# Patient Record
Sex: Female | Born: 1950 | Race: White | Hispanic: Yes | Smoking: Former smoker
Health system: Southern US, Community
[De-identification: ages and names within clinical notes are randomized; demographics above are authoritative.]

## PROBLEM LIST (undated history)

## (undated) DIAGNOSIS — I10 Essential (primary) hypertension: Secondary | ICD-10-CM

## (undated) HISTORY — PX: BREAST BIOPSY: SHX20

## (undated) HISTORY — PX: CERVICAL CONE BIOPSY: SUR198

## (undated) HISTORY — DX: Essential (primary) hypertension: I10

---

## 1990-09-03 HISTORY — PX: ABDOMINAL HYSTERECTOMY: SHX81

## 2005-02-16 ENCOUNTER — Encounter: Admission: RE | Admit: 2005-02-16 | Discharge: 2005-02-16 | Payer: Self-pay | Admitting: Internal Medicine

## 2006-02-04 ENCOUNTER — Ambulatory Visit: Payer: Self-pay | Admitting: Family Medicine

## 2006-02-19 ENCOUNTER — Ambulatory Visit: Payer: Self-pay | Admitting: Family Medicine

## 2006-03-05 ENCOUNTER — Ambulatory Visit (HOSPITAL_COMMUNITY): Admission: RE | Admit: 2006-03-05 | Discharge: 2006-03-05 | Payer: Self-pay | Admitting: Family Medicine

## 2006-03-21 ENCOUNTER — Ambulatory Visit: Payer: Self-pay | Admitting: Internal Medicine

## 2008-05-28 ENCOUNTER — Emergency Department (HOSPITAL_COMMUNITY): Admission: EM | Admit: 2008-05-28 | Discharge: 2008-05-28 | Payer: Self-pay | Admitting: Emergency Medicine
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
## 2010-03-10 ENCOUNTER — Encounter: Admission: RE | Admit: 2010-03-10 | Discharge: 2010-03-10 | Payer: Self-pay | Admitting: Internal Medicine

## 2011-05-02 ENCOUNTER — Other Ambulatory Visit: Payer: Self-pay | Admitting: Internal Medicine

## 2011-05-02 DIAGNOSIS — Z1231 Encounter for screening mammogram for malignant neoplasm of breast: Secondary | ICD-10-CM

## 2011-05-16 ENCOUNTER — Ambulatory Visit: Payer: Self-pay

## 2011-05-24 ENCOUNTER — Ambulatory Visit
Admission: RE | Admit: 2011-05-24 | Discharge: 2011-05-24 | Disposition: A | Discharge status: Home / Self Care | Payer: Self-pay | Source: Ambulatory Visit | Attending: Internal Medicine | Admitting: Internal Medicine

## 2011-05-24 DIAGNOSIS — Z1231 Encounter for screening mammogram for malignant neoplasm of breast: Secondary | ICD-10-CM

## 2016-03-16 ENCOUNTER — Encounter: Payer: Self-pay | Admitting: Critical Care Medicine

## 2016-03-16 ENCOUNTER — Encounter: Payer: Self-pay | Admitting: Pharmacist

## 2016-03-16 DIAGNOSIS — I10 Essential (primary) hypertension: Secondary | ICD-10-CM

## 2016-03-16 LAB — GLUCOSE, POCT (MANUAL RESULT ENTRY): POC Glucose: 68 mg/dl — AB (ref 70–99)

## 2016-03-16 NOTE — Assessment & Plan Note (Signed)
HTN new onset Not well controlled Plan Lisinopril hctz 10/12.5 daily Rx sent and samples given Comm health wellness visit given

## 2016-03-16 NOTE — Progress Notes (Signed)
   Subjective:    Patient ID: Becky Holland, female    DOB: September 21, 1950, 65 y.o.   MRN: [id number]  Hypertension This is a new problem. The problem is uncontrolled. Pertinent negatives include no anxiety, blurred vision, chest pain, headaches, malaise/fatigue, neck pain, orthopnea, palpitations, peripheral edema, PND, shortness of breath or sweats. There are no associated agents to hypertension. There are no known risk factors for coronary artery disease. Past treatments include nothing. There is no history of angina, kidney disease, CVA or heart failure. There is no history of chronic renal disease or a hypertension causing med.      Review of Systems  Constitutional: Negative for malaise/fatigue.  Eyes: Negative for blurred vision.  Respiratory: Negative for shortness of breath.   Cardiovascular: Negative for chest pain, palpitations, orthopnea and PND.  Musculoskeletal: Negative for neck pain.  Neurological: Negative for headaches.       Objective:   Physical Exam Filed Vitals:   03/16/16 1200  BP: 163/91  Pulse: 77    Gen: Pleasant, well-nourished, in no distress,  normal affect  ENT: No lesions,  mouth clear,  oropharynx clear, no postnasal drip  Neck: No JVD, no TMG, no carotid bruits  Lungs: No use of accessory muscles, no dullness to percussion, clear without rales or rhonchi  Cardiovascular: RRR, heart sounds normal, no murmur or gallops, no peripheral edema  Abdomen: soft and NT, no HSM,  BS normal  Musculoskeletal: No deformities, no cyanosis or clubbing  Neuro: alert, non focal  Skin: Warm, no lesions or rashes  No results found.        Assessment & Plan:  I personally reviewed all images and lab data in the [facility] system as well as any outside material available during this office visit and agree with the  radiology impressions.   HTN (hypertension) HTN new onset Not well controlled Plan Lisinopril hctz 10/12.5 daily Rx sent and samples  given Comm health wellness visit given    [name] was seen today for hypertension.  Diagnoses and all orders for this visit:  Essential hypertension

## 2016-03-16 NOTE — Progress Notes (Signed)
Patient ID: Becky Holland, female   DOB: 01/26/1951, 65 y.o.   MRN: [id number]  [place] with Dr. Asencion Noble Lisinopril-hydrochlorothiazide 10-12.5 mg 1 tablet daily started and dispensed 14 tablets  Patient education provided and patient verbalized understanding. Patient referred to [facility] to establish care.

## 2016-03-16 NOTE — Addendum Note (Signed)
Addended by: Forde Dandy on: 03/16/2016 11:20 AM   Modules accepted: Medications

## 2016-03-20 ENCOUNTER — Inpatient Hospital Stay: Payer: Self-pay | Admitting: Family Medicine

## 2016-04-03 ENCOUNTER — Ambulatory Visit: Payer: Self-pay | Attending: Internal Medicine

## 2016-04-09 ENCOUNTER — Ambulatory Visit: Payer: Self-pay

## 2016-04-16 ENCOUNTER — Ambulatory Visit: Payer: Self-pay | Attending: Internal Medicine

## 2017-03-01 ENCOUNTER — Encounter (HOSPITAL_COMMUNITY): Payer: Self-pay | Admitting: Emergency Medicine

## 2017-03-01 ENCOUNTER — Ambulatory Visit (HOSPITAL_COMMUNITY)
Admission: EM | Admit: 2017-03-01 | Discharge: 2017-03-01 | Disposition: A | Discharge status: Home / Self Care | Payer: Self-pay | Attending: Internal Medicine | Admitting: Internal Medicine

## 2017-03-01 DIAGNOSIS — S00462A Insect bite (nonvenomous) of left ear, initial encounter: Secondary | ICD-10-CM

## 2017-03-01 DIAGNOSIS — W57XXXA Bitten or stung by nonvenomous insect and other nonvenomous arthropods, initial encounter: Secondary | ICD-10-CM

## 2017-03-01 DIAGNOSIS — R21 Rash and other nonspecific skin eruption: Secondary | ICD-10-CM

## 2017-03-01 MED ORDER — CETIRIZINE HCL 5 MG PO TABS: 5.0000 mg | ORAL_TABLET | Freq: Every day | ORAL | 0 refills | Status: DC

## 2017-03-01 NOTE — ED Triage Notes (Signed)
Pt c/o rash on left ear onset 7 days... Thought it was due to an insect bite  Denies fevers, chills  A&O x4... NAD... Ambulatory

## 2017-03-01 NOTE — ED Provider Notes (Signed)
CSN: [id number]     Arrival date & time 03/01/17  1738 History   None    Chief Complaint  Patient presents with  . Rash   (Consider location/radiation/quality/duration/timing/severity/associated sxs/prior Treatment) 66 yo female comes in with 1 week history of rash and itching of the left external ear. Thinks it is due to a bug bite. Patient has been scratching the area. Denies ear discharge/pain, decrease in hearing. Denies fever, chills, malaise. Denies burning sensation.       History reviewed. No pertinent past medical history. Past Surgical History:  Procedure Laterality Date  . ABDOMINAL HYSTERECTOMY     History reviewed. No pertinent family history. Social History  Substance Use Topics  . Smoking status: Never Smoker  . Smokeless tobacco: Never Used  . Alcohol use Not on file   OB History    No data available     Review of Systems  Constitutional: Negative for chills, diaphoresis, fatigue and fever.  HENT: Negative for ear discharge and ear pain.   Respiratory: Negative for shortness of breath and wheezing.   Cardiovascular: Negative for chest pain and palpitations.  Skin: Positive for rash.    Allergies  Naproxen  Home Medications   Prior to Admission medications   Medication Sig Start Date End Date Taking? Authorizing Provider  cetirizine (ZYRTEC) 5 MG tablet Take 1 tablet (5 mg total) by mouth daily. 03/01/17   Tasia Catchings, Yashika Mask V, PA-C  lisinopril-hydrochlorothiazide (PRINZIDE,ZESTORETIC) 10-12.5 MG tablet Take 1 tablet by mouth daily.    [provider]   Meds Ordered and Administered this Visit  Medications - No data to display  BP (!) 150/84 (BP Location: Right Arm)   Pulse 74   Temp 98.7 F (37.1 C) (Oral)   Resp 18   SpO2 98%  No data found.   Physical Exam  Constitutional: She is oriented to person, place, and time. She appears well-developed and well-nourished.  HENT:  Head: Normocephalic and atraumatic.  Right Ear: Tympanic membrane,  external ear and ear canal normal.  Left Ear: Tympanic membrane and ear canal normal.  Nose: Nose normal. Right sinus exhibits no maxillary sinus tenderness and no frontal sinus tenderness. Left sinus exhibits no maxillary sinus tenderness and no frontal sinus tenderness.  Mouth/Throat: Uvula is midline, oropharynx is clear and moist and mucous membranes are normal.  Left external ear with isolated rash/wound. Swelling of the external ear with erythema. No increased warmth.   Lymphadenopathy:    She has cervical adenopathy.  Neurological: She is alert and oriented to person, place, and time.  Skin: Skin is warm and dry.  Psychiatric: She has a normal mood and affect. Her behavior is normal. Judgment normal.    Urgent Care Course     Procedures (including critical care time)  Labs Review Labs Reviewed - No data to display  Imaging Review No results found.        MDM   1. Bug bite, initial encounter    1. Discussed with patient likely having a local reaction to bug bite. Start Cetirizine 5mg  QD to help with the itching. Encouraged patient to stop itching. Currently with no signs of infection, patient to monitor for increased warmth, spreading redness, changes in hearing. Given rash is isolated to one, with local swelling that spreads throughout multiple dermatomes, low suspicion for shingles.     Ok Edwards, PA-C 03/01/17 1835

## 2017-09-03 DIAGNOSIS — I10 Essential (primary) hypertension: Secondary | ICD-10-CM | POA: Insufficient documentation

## 2017-09-03 HISTORY — DX: Essential (primary) hypertension: I10

## 2018-05-04 HISTORY — PX: EYE SURGERY: SHX253

## 2018-07-04 HISTORY — PX: EYE SURGERY: SHX253

## 2018-10-21 ENCOUNTER — Ambulatory Visit: Payer: Self-pay | Admitting: Internal Medicine

## 2018-10-21 ENCOUNTER — Encounter: Payer: Self-pay | Admitting: Internal Medicine

## 2018-10-21 VITALS — BP 128/78 | HR 84 | Resp 14 | Ht <= 58 in | Wt 135.5 lb

## 2018-10-21 DIAGNOSIS — M778 Other enthesopathies, not elsewhere classified: Secondary | ICD-10-CM

## 2018-10-21 DIAGNOSIS — J3089 Other allergic rhinitis: Secondary | ICD-10-CM

## 2018-10-21 DIAGNOSIS — Z79899 Other long term (current) drug therapy: Secondary | ICD-10-CM

## 2018-10-21 DIAGNOSIS — M7581 Other shoulder lesions, right shoulder: Principal | ICD-10-CM

## 2018-10-21 DIAGNOSIS — I10 Essential (primary) hypertension: Secondary | ICD-10-CM

## 2018-10-21 DIAGNOSIS — M7582 Other shoulder lesions, left shoulder: Principal | ICD-10-CM

## 2018-10-21 MED ORDER — LISINOPRIL 20 MG PO TABS: 20.0000 mg | ORAL_TABLET | Freq: Every day | ORAL | 3 refills | Status: DC

## 2018-10-21 MED ORDER — CETIRIZINE HCL 10 MG PO TABS: 10.0000 mg | ORAL_TABLET | Freq: Every day | ORAL | 11 refills | Status: DC

## 2018-10-21 MED ORDER — ACETAMINOPHEN 500 MG PO TABS: 500.0000 mg | ORAL_TABLET | Freq: Four times a day (QID) | ORAL | 0 refills | Status: DC | PRN

## 2018-10-21 NOTE — Progress Notes (Signed)
Subjective:    Patient ID: Becky Holland, female   DOB: 1951/03/20, 68 y.o.   MRN: [id number]   HPI   Here to establish  1.  Hoarseness with runny nose and posterior pharyngeal drainage.  She does have an itchy throat that leads to a cough.  Eyes run as well, but not itchy.  + Sneezing at times.  Started as a cold about 1.5 months ago. Symptoms are worse when she wakes in the morning.   Changes her bedsheets every other week. They do have a lot of trees around the home and leaves keep collecting there.   Has tried honey. Also treated with prednisone, Azithromycin, Tessalon perles by a previous physician beginning of January.   Symptoms improved, but still there.  She has not had this before. Lives in [place] on [address].  Has lived there 5 years.   She has not noted moisture or mold issues she is aware of.   They have had rats and cockroaches in the trailer, but that was 6 months to 1 year ago.  She is using some insecticide powder to keep the roach population down. No carpeting.  2.  Skin tag:  Upper left back.  Has enlarged with time.  No pain or bleeding.  No color change--always black.  3.  Hypertension:  Not clear how long ago she was diagnosed with hypertension.  Has a prescription dated 2017, though just restarted on medication, Lisinopril 20 mg, about 2 months ago.    4.  Bilateral arm pain:  Points to upper arms.  Pain with abduction and flexion.  Points to insertion of deltoid on humerus.  Feels popping in that area.  Has had this pain for about 2 years.   Takes Calcium and feels it improved.   Feels a pulsating throbbing discomfort.   Current Meds  Medication Sig  . lisinopril (PRINIVIL,ZESTRIL) 20 MG tablet Take 20 mg by mouth daily.   Allergies  Allergen Reactions  . Naproxen    Past Medical History:  Diagnosis Date  . Hypertension 2019    Past Surgical History:  Procedure Laterality Date  . ABDOMINAL HYSTERECTOMY  1992   For fibroid tumor   . BREAST BIOPSY     Benign--about age 62  . CERVICAL CONE BIOPSY     probably around age 65 or 26    Family History  Problem Relation Age of Onset  . Stroke Mother   . 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 Stroke Brother 34  . Hypertension Brother     Social History   Socioeconomic History  . Marital status: Widowed    Spouse name: Not on file  . Number of children: 3  . Years of education: Not on file  . Highest education level: Not on file  Occupational History  . Occupation: business Solicitor Needs  . Financial resource strain: Not on file  . Food insecurity:    Worry: Not on file    Inability: Not on file  . Transportation needs:    Medical: Not on file    Non-medical: Not on file  Tobacco Use  . Smoking status: Never Smoker  . Smokeless tobacco: Never Used  Substance and Sexual Activity  . Alcohol use: Yes    Frequency: Never    Comment: History of overuse many years ago when husband left her.  Rare now.    . Drug use: Never  .  Sexual activity: Not on file  Lifestyle  . Physical activity:    Days per week: Not on file    Minutes per session: Not on file  . Stress: Not on file  Relationships  . Social connections:    Talks on phone: Not on file    Gets together: Not on file    Attends religious service: Not on file    Active member of club or organization: Not on file    Attends meetings of clubs or organizations: Not on file    Relationship status: Not on file  . Intimate partner violence:    Fear of current or ex partner: Not on file    Emotionally abused: Not on file    Physically abused: Not on file    Forced sexual activity: Not on file  Other Topics Concern  . Not on file  Social History Narrative   Husband left her thought never formally divorced.     He died in early 86s   Lives with her daughter and her husband and 3 grandchildren.     Review of Systems    Objective:   BP 128/78 (BP Location:  Left Arm, Patient Position: Sitting, Cuff Size: Normal)   Pulse 84   Resp 14   Ht 4\' 8"  (1.422 m)   Wt 135 lb 8 oz (61.5 kg)   BMI 30.38 kg/m   Physical Exam  NAD HEENT:  PERRL, EOMI, conjunctivae without injection.  TMs pearly gray.  Nasal mucosa swollen and boggy.   Mild cobbling of posterior pharynx. Neck:  Supple, No adenopathy, no thyromegaly Chest:  CTA CV:  RRR with normal S1 and S2, No S3, S4 or murmur.  No carotid bruits.  Carotid, radial and DP pulses normal and equal Abd:  S, NT, No HSM or mass, + BS LE:  No edema. Skin:  4 mm well defined dark brown to black smooth round lesion on left mid back. MS:  Full ROM of upper arms/shoulders.  Tender only where deltoid inserts.    NT over subacromial bursae, AC and CC joints.  Assessment & Plan  1.  Allergies:  Zyrtec 10 mg daily. Discussed pillow and mattress covers and cleaning bedsheets weekly along with weekly dusting and vacuuming. To remove shoes at door and keep leaves cleaned up around home to decrease mold formation. [employer] referral for [employer] inspection and recommendations:   Hold on referral until her daughter, who owns the trailer can come with her.  She is afraid this referral will make her daughter angry.   2.  Hypertension:  Controlled on Lisinopril.  Fasting labs to check renal function in 1 week.  3.  Shoulder tendinitis:  Seems most prominent at insertion of deltoids bilaterally.  Tylenol on regular basis--limited with NSAID allergy.  Gentle ROM exercises given.  4.  Skin lesion on back:  Appears benign.  Watch for now.  Can remove, but discussed would leave a scar.

## 2018-10-24 ENCOUNTER — Telehealth: Payer: Self-pay | Admitting: Internal Medicine

## 2018-10-24 NOTE — Telephone Encounter (Signed)
Social Work Theatre manager ([place]) spoke with Jackqueline to welcome her to [employer] and offer support services. Destry was happy to receive the call and said that she was interested in services and would appreciate a call back next week.

## 2018-10-29 ENCOUNTER — Telehealth: Payer: Self-pay | Admitting: Internal Medicine

## 2018-10-29 NOTE — Telephone Encounter (Signed)
The social work intern ([place]) called patient and left message regarding her need for a payment plan so that she can pay her medical bills. The SWI encouraged the patient to call back if she still needs help with this issue.

## 2018-10-30 ENCOUNTER — Other Ambulatory Visit: Payer: Self-pay

## 2018-10-31 ENCOUNTER — Other Ambulatory Visit: Payer: Self-pay

## 2018-10-31 DIAGNOSIS — Z13220 Encounter for screening for lipoid disorders: Secondary | ICD-10-CM

## 2018-10-31 DIAGNOSIS — Z79899 Other long term (current) drug therapy: Secondary | ICD-10-CM

## 2018-11-01 LAB — COMPREHENSIVE METABOLIC PANEL
A/G RATIO: 2 (ref 1.2–2.2)
ALT: 21 IU/L (ref 0–32)
AST: 23 IU/L (ref 0–40)
Albumin: 4.1 g/dL (ref 3.8–4.8)
Alkaline Phosphatase: 74 IU/L (ref 39–117)
BILIRUBIN TOTAL: 0.3 mg/dL (ref 0.0–1.2)
BUN / CREAT RATIO: 29 — AB (ref 12–28)
BUN: 20 mg/dL (ref 8–27)
CHLORIDE: 108 mmol/L — AB (ref 96–106)
CO2: 24 mmol/L (ref 20–29)
Calcium: 9.6 mg/dL (ref 8.7–10.3)
Creatinine, Ser: 0.7 mg/dL (ref 0.57–1.00)
GFR calc non Af Amer: 89 mL/min/{1.73_m2} (ref >59)
GFR, EST AFRICAN AMERICAN: 103 mL/min/{1.73_m2} (ref >59)
Globulin, Total: 2.1 g/dL (ref 1.5–4.5)
Glucose: 93 mg/dL (ref 65–99)
POTASSIUM: 5.4 mmol/L — AB (ref 3.5–5.2)
SODIUM: 144 mmol/L (ref 134–144)
TOTAL PROTEIN: 6.2 g/dL (ref 6.0–8.5)

## 2018-11-01 LAB — CBC WITH DIFFERENTIAL/PLATELET
BASOS: 1 %
Basophils Absolute: 0.1 10*3/uL (ref 0.0–0.2)
EOS (ABSOLUTE): 0.1 10*3/uL (ref 0.0–0.4)
Eos: 2 %
Hematocrit: 40.5 % (ref 34.0–46.6)
Hemoglobin: 13.2 g/dL (ref 11.1–15.9)
IMMATURE GRANS (ABS): 0 10*3/uL (ref 0.0–0.1)
Immature Granulocytes: 0 %
Lymphocytes Absolute: 2.3 10*3/uL (ref 0.7–3.1)
Lymphs: 40 %
MCH: 30.9 pg (ref 26.6–33.0)
MCHC: 32.6 g/dL (ref 31.5–35.7)
MCV: 95 fL (ref 79–97)
Monocytes Absolute: 0.6 10*3/uL (ref 0.1–0.9)
Monocytes: 10 %
NEUTROS ABS: 2.8 10*3/uL (ref 1.4–7.0)
Neutrophils: 47 %
PLATELETS: 433 10*3/uL (ref 150–450)
RBC: 4.27 x10E6/uL (ref 3.77–5.28)
RDW: 13.1 % (ref 11.7–15.4)
WBC: 5.9 10*3/uL (ref 3.4–10.8)

## 2018-11-01 LAB — LIPID PANEL W/O CHOL/HDL RATIO
CHOLESTEROL TOTAL: 238 mg/dL — AB (ref 100–199)
HDL: 54 mg/dL (ref >39)
LDL Calculated: 155 mg/dL — ABNORMAL HIGH (ref 0–99)
TRIGLYCERIDES: 146 mg/dL (ref 0–149)
VLDL CHOLESTEROL CAL: 29 mg/dL (ref 5–40)

## 2018-11-13 ENCOUNTER — Other Ambulatory Visit: Payer: Self-pay

## 2018-11-13 DIAGNOSIS — E875 Hyperkalemia: Secondary | ICD-10-CM

## 2018-11-14 LAB — BASIC METABOLIC PANEL
BUN/Creatinine Ratio: 23 (ref 12–28)
BUN: 18 mg/dL (ref 8–27)
CALCIUM: 9.5 mg/dL (ref 8.7–10.3)
CO2: 24 mmol/L (ref 20–29)
CREATININE: 0.78 mg/dL (ref 0.57–1.00)
Chloride: 106 mmol/L (ref 96–106)
GFR calc Af Amer: 90 mL/min/{1.73_m2} (ref >59)
GFR, EST NON AFRICAN AMERICAN: 78 mL/min/{1.73_m2} (ref >59)
Glucose: 89 mg/dL (ref 65–99)
POTASSIUM: 5.3 mmol/L — AB (ref 3.5–5.2)
Sodium: 143 mmol/L (ref 134–144)

## 2018-12-10 ENCOUNTER — Other Ambulatory Visit: Payer: Self-pay

## 2019-01-07 ENCOUNTER — Other Ambulatory Visit (INDEPENDENT_AMBULATORY_CARE_PROVIDER_SITE_OTHER): Payer: Self-pay

## 2019-01-07 ENCOUNTER — Other Ambulatory Visit: Payer: Self-pay

## 2019-01-07 DIAGNOSIS — I10 Essential (primary) hypertension: Secondary | ICD-10-CM

## 2019-01-08 LAB — BASIC METABOLIC PANEL
BUN/Creatinine Ratio: 28 (ref 12–28)
BUN: 18 mg/dL (ref 8–27)
CO2: 23 mmol/L (ref 20–29)
Calcium: 9.5 mg/dL (ref 8.7–10.3)
Chloride: 102 mmol/L (ref 96–106)
Creatinine, Ser: 0.64 mg/dL (ref 0.57–1.00)
GFR calc Af Amer: 106 mL/min/{1.73_m2} (ref >59)
GFR calc non Af Amer: 92 mL/min/{1.73_m2} (ref >59)
Glucose: 93 mg/dL (ref 65–99)
Potassium: 5.3 mmol/L — ABNORMAL HIGH (ref 3.5–5.2)
Sodium: 138 mmol/L (ref 134–144)

## 2019-01-19 ENCOUNTER — Ambulatory Visit: Payer: Self-pay | Admitting: Internal Medicine

## 2019-01-19 ENCOUNTER — Other Ambulatory Visit: Payer: Self-pay

## 2019-01-19 ENCOUNTER — Encounter: Payer: Self-pay | Admitting: Internal Medicine

## 2019-01-19 VITALS — BP 122/80 | HR 82 | Resp 12 | Ht <= 58 in | Wt 139.0 lb

## 2019-01-19 DIAGNOSIS — I10 Essential (primary) hypertension: Secondary | ICD-10-CM

## 2019-01-19 DIAGNOSIS — Z1239 Encounter for other screening for malignant neoplasm of breast: Secondary | ICD-10-CM

## 2019-01-19 DIAGNOSIS — Z9189 Other specified personal risk factors, not elsewhere classified: Secondary | ICD-10-CM

## 2019-01-19 DIAGNOSIS — Z0000 Encounter for general adult medical examination without abnormal findings: Secondary | ICD-10-CM

## 2019-01-19 LAB — POCT URINALYSIS DIPSTICK
Bilirubin, UA: NEGATIVE
Glucose, UA: NEGATIVE
Ketones, UA: NEGATIVE
Leukocytes, UA: NEGATIVE
Nitrite, UA: NEGATIVE
Protein, UA: NEGATIVE
Spec Grav, UA: <=1.005 — AB (ref 1.010–1.025)
Urobilinogen, UA: 0.2 E.U./dL
pH, UA: 6.5 (ref 5.0–8.0)

## 2019-01-19 MED ORDER — MOMETASONE FUROATE 50 MCG/ACT NA SUSP: NASAL | 11 refills | Status: DC

## 2019-01-19 MED ORDER — LISINOPRIL-HYDROCHLOROTHIAZIDE 10-12.5 MG PO TABS: 1.0000 | ORAL_TABLET | Freq: Every day | ORAL | 11 refills | Status: DC

## 2019-01-19 NOTE — Progress Notes (Signed)
Subjective:    Patient ID: Becky Holland, female   DOB: 10/24/1950, 68 y.o.   MRN: [id number]   HPI   Lanelle Bal, her granddaughter, interprets.    CPE without pap  1.  Pap:  Has not had a pap smear for more than 8 years. History of conization of cervix when in her 2s for precancerous cells.   Had a hysterectomy for fibroids later in life.    2.  Mammogram:  She thinks last was in 2012.  Normal.  No family history of breast cancer  3.  Osteoprevention:  Likes milk, but cannot drink it in large amounts as causes bloating and abdominal pain.  She would be willing to try Lactaid milk.  Does not like Almond Milk. Does walk at times --maybe once or twice weekly. Does feel she can get out daily and work up to 1 hour of physical activity.  4.  Guaiac Cards:  Never.    5.  Colonoscopy:  Never.  No family history of colon cancer.  6.  Immunizations:  Tdap about 8 years ago.  She thinks at [facility].    7.  Glucose/Cholesterol:  No elevated glucose in past.  Her cholesterol was mildly elevated in 2.2020 Lipid Panel     Component Value Date/Time   CHOL 238 (H) 10/31/2018 0849   TRIG 146 10/31/2018 0849   HDL 54 10/31/2018 0849   LDLCALC 155 (H) 10/31/2018 0849    Current Meds  Medication Sig  . acetaminophen (TYLENOL) 500 MG tablet Take 1 tablet (500 mg total) by mouth every 6 (six) hours as needed.  . cetirizine (ZYRTEC) 10 MG tablet Take 1 tablet (10 mg total) by mouth daily.  Marland Kitchen lisinopril (PRINIVIL,ZESTRIL) 20 MG tablet Take 1 tablet (20 mg total) by mouth daily.   Allergies  Allergen Reactions  . Naproxen Swelling    Eyes swelled with Naproxen    Past Medical History:  Diagnosis Date  . Hypertension 2019    Past Surgical History:  Procedure Laterality Date  . ABDOMINAL HYSTERECTOMY  1992   For fibroid tumor; also left Oophorectomy.    Marland Kitchen BREAST BIOPSY     Benign--about age 34  . CERVICAL CONE BIOPSY     probably around age 62 or 50 for  precancerous cells    Family History  Problem Relation Age of Onset  . Stroke Mother   . 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 Stroke Brother 5  . Hypertension Brother     Social History   Socioeconomic History  . Marital status: Widowed    Spouse name: Not on file  . Number of children: 3  . Years of education: Not on file  . Highest education level: Not on file  Occupational History  . Occupation: business Solicitor Needs  . Financial resource strain: Not on file  . Food insecurity:    Worry: Not on file    Inability: Not on file  . Transportation needs:    Medical: Not on file    Non-medical: Not on file  Tobacco Use  . Smoking status: Never Smoker  . Smokeless tobacco: Never Used  Substance and Sexual Activity  . Alcohol use: Yes    Frequency: Never    Comment: History of overuse many years ago when husband left her.  Rare now.    . Drug use: Never  . Sexual activity: Not on file  Lifestyle  . Physical activity:    Days per week: Not on file    Minutes per session: Not on file  . Stress: Not on file  Relationships  . Social connections:    Talks on phone: Not on file    Gets together: Not on file    Attends religious service: Not on file    Active member of club or organization: Not on file    Attends meetings of clubs or organizations: Not on file    Relationship status: Not on file  . Intimate partner violence:    Fear of current or ex partner: Not on file    Emotionally abused: Not on file    Physically abused: Not on file    Forced sexual activity: Not on file  Other Topics Concern  . Not on file  Social History Narrative   Husband left her though never formally divorced.     He died in early 80s   Lives with her daughter and her husband and 3 grandchildren.     Review of Systems  Constitutional: Negative for appetite change, fatigue and fever.  HENT: Positive for hearing loss (Decreased hearing from right  ear for past 2 months.), postnasal drip, rhinorrhea, sneezing and sore throat (Throat itchy at times). Negative for dental problem (Does not like her teeth separation.  Her teeth break easily.  Would like a dental referral.) and mouth sores (Decreased hearing from right ear.  Maybe for past 2 months. ).   Eyes: Positive for discharge (clear watering.) and redness. Negative for itching.  Respiratory: Negative for shortness of breath.   Cardiovascular: Negative for chest pain, palpitations and leg swelling.  Gastrointestinal: Positive for constipation (rare) and diarrhea (rare). Negative for abdominal pain and blood in stool (No melena.).  Genitourinary: Negative for vaginal bleeding and vaginal discharge.  Musculoskeletal: Positive for arthralgias (Some pain in knees at times.).       Left thigh cramps at times.  Skin: Negative for rash.  Neurological: Positive for headaches (pinching pain in right temple about 4 days ago.  Lasted most of the day.  Took Tylenol twice and awakened next day fine. ). Negative for weakness and numbness.  Psychiatric/Behavioral: Negative for dysphoric mood. The patient is not nervous/anxious.       Objective:   BP 122/80 (BP Location: Left Arm, Patient Position: Sitting, Cuff Size: Normal)   Pulse 82   Resp 12   Ht 4\' 8"  (1.422 m)   Wt 139 lb (63 kg)   BMI 31.16 kg/m   Physical Exam  Constitutional: She is oriented to person, place, and time. She appears well-developed and well-nourished.  HENT:  Head: Normocephalic and atraumatic.  Right Ear: Tympanic membrane, external ear and ear canal normal.  Left Ear: Tympanic membrane, external ear and ear canal normal.  Nose: Mucosal edema present.  Mouth/Throat: Uvula is midline, oropharynx is clear and moist and mucous membranes are normal.  Eyes: Pupils are equal, round, and reactive to light. Conjunctivae and EOM are normal.  Discs sharp bilaterally  Neck: Normal range of motion and full passive range of  motion without pain. Neck supple. No thyroid mass and no thyromegaly present.  Cardiovascular: Normal rate, regular rhythm, S1 normal and S2 normal. Exam reveals no S3, no S4 and no friction rub.  No murmur heard. No carotid bruits.  Carotid, radial, femoral, DP and PT pulses normal and equal.   Varicosities of lower extremities.  Pulmonary/Chest: Effort normal and breath sounds  normal. Right breast exhibits no inverted nipple, no mass, no nipple discharge, no skin change and no tenderness. Left breast exhibits no inverted nipple, no mass, no nipple discharge, no skin change and no tenderness.  Abdominal: Soft. Bowel sounds are normal. She exhibits no mass. There is no hepatosplenomegaly. There is no abdominal tenderness. No hernia.  Genitourinary:    Genitourinary Comments: Normal external female genitalia No uterine or adnexal mass or tenderness Rectal:  No mass, heme negative light brown stool.   Musculoskeletal: Normal range of motion.     Comments: Hypertrophic changes of knees bilaterally.  Lymphadenopathy:       Head (right side): No submental and no submandibular adenopathy present.       Head (left side): No submental and no submandibular adenopathy present.    She has no cervical adenopathy.    She has no axillary adenopathy.       Right: No inguinal and no supraclavicular adenopathy present.       Left: No inguinal and no supraclavicular adenopathy present.  Neurological: She is alert and oriented to person, place, and time. She has normal strength and normal reflexes. No cranial nerve deficit or sensory deficit. Coordination and gait normal.  Skin: Skin is warm. No rash noted.  Psychiatric: She has a normal mood and affect. Her speech is normal and behavior is normal. Judgment and thought content normal.     Assessment & Plan   1.  CPE without pap Schedule mammogram Due for tetanus booster in 2 more years. Encouraged influenza vaccine.  2.  Recurrent mild hyperkalemia:   Switch from Lisinopril 20 mg to Lisinopril/HCTZ 10/12.5 mg as seems most likely from the Lisinopril.  3  Hypertension:  Changes to Lisinopril as above.  4.  Varicose Veins:  No symptoms.  Encourage elevation of legs in the evenings.  5.  OA of knees.  Tylenol as needed  6.  Right Eustachian Tube dysfunction.  Appears to have mild allergies.  Zyrtec 10 mg daily. Nasonex 2 sprays each nostril daily.  7.  Dental referral

## 2019-01-19 NOTE — Patient Instructions (Signed)

## 2019-01-26 ENCOUNTER — Encounter: Payer: Self-pay | Admitting: Internal Medicine

## 2019-02-02 ENCOUNTER — Other Ambulatory Visit: Payer: Self-pay

## 2019-02-02 ENCOUNTER — Other Ambulatory Visit (INDEPENDENT_AMBULATORY_CARE_PROVIDER_SITE_OTHER): Payer: Self-pay

## 2019-02-02 DIAGNOSIS — Z1211 Encounter for screening for malignant neoplasm of colon: Secondary | ICD-10-CM

## 2019-02-02 LAB — POC HEMOCCULT BLD/STL (HOME/3-CARD/SCREEN)
Card #2 Fecal Occult Blod, POC: NEGATIVE
Card #3 Fecal Occult Blood, POC: NEGATIVE
Fecal Occult Blood, POC: NEGATIVE

## 2019-02-05 ENCOUNTER — Telehealth: Payer: Self-pay | Admitting: Internal Medicine

## 2019-02-05 NOTE — Telephone Encounter (Signed)
Patient called requesting any medication for itchiness on the back of her right leg above the ankle x two days, patient states has swollen on same leg since yesterday. Patient has been  applying benadryl cream original 1% multiples times (more than 5 times), she states cream helped a little bit but symptoms stills. Patient thinks was stuck by an insect; not sure which one was it.  Please advise.

## 2019-02-10 NOTE — Telephone Encounter (Signed)
Called patient back to get further details. Patient states she is better and does not need appointment. Patient states she was stung by a wasp.

## 2019-02-19 ENCOUNTER — Other Ambulatory Visit: Payer: Self-pay

## 2019-07-16 ENCOUNTER — Other Ambulatory Visit (HOSPITAL_COMMUNITY): Payer: Self-pay | Admitting: *Deleted

## 2019-07-16 DIAGNOSIS — N644 Mastodynia: Secondary | ICD-10-CM

## 2019-07-23 ENCOUNTER — Encounter: Payer: Self-pay | Admitting: Internal Medicine

## 2019-07-23 ENCOUNTER — Ambulatory Visit: Payer: Self-pay | Admitting: Internal Medicine

## 2019-07-23 ENCOUNTER — Other Ambulatory Visit: Payer: Self-pay

## 2019-07-23 VITALS — BP 130/80 | HR 72 | Resp 12 | Ht <= 58 in | Wt 129.0 lb

## 2019-07-23 DIAGNOSIS — E875 Hyperkalemia: Secondary | ICD-10-CM

## 2019-07-23 DIAGNOSIS — I10 Essential (primary) hypertension: Secondary | ICD-10-CM

## 2019-07-23 DIAGNOSIS — Z23 Encounter for immunization: Secondary | ICD-10-CM

## 2019-07-23 DIAGNOSIS — J3089 Other allergic rhinitis: Secondary | ICD-10-CM | POA: Insufficient documentation

## 2019-07-23 DIAGNOSIS — R0781 Pleurodynia: Secondary | ICD-10-CM

## 2019-07-23 DIAGNOSIS — D171 Benign lipomatous neoplasm of skin and subcutaneous tissue of trunk: Secondary | ICD-10-CM

## 2019-07-23 DIAGNOSIS — E7800 Pure hypercholesterolemia, unspecified: Secondary | ICD-10-CM | POA: Insufficient documentation

## 2019-07-23 MED ORDER — MOMETASONE FUROATE 50 MCG/ACT NA SUSP: NASAL | 11 refills | Status: DC

## 2019-07-23 NOTE — Patient Instructions (Signed)
[facility] First floor [address], [address] [place],  [address] 

## 2019-07-23 NOTE — Progress Notes (Signed)
Subjective:    Patient ID: Becky Holland, female   DOB: 09-Oct-1950, 68 y.o.   MRN: [id number]   HPI   1.  Hypercholesterolemia:  High back in February.  States she has changed her diet and physical activity.  She is eating more vegetables, but is not eating fruit much as she feels it has too much sugar. She is walking daily for 2 hours. Has lost 10 lbs  Lipid Panel     Component Value Date/Time   CHOL 238 (H) 10/31/2018 0849   TRIG 146 10/31/2018 0849   HDL 54 10/31/2018 0849   LDLCALC 155 (H) 10/31/2018 0849   LABVLDL 29 10/31/2018 0849   2.  Allergies:  Has a lot of posterior pharyngeal drainage.  Never went to [facility] to obtain Nasonex.   She did purchase OTC at [facility] and only used for 2 weeks.  She misunderstood directions and stopped after that.  She did find it helped with her drainage and cough.  3.  Hypertension:  Doing fine on Lisinopril/HCTZ daily.   4.  Has mass that is tender on bilateral posterior chest wall just lateral to scapulae--almost in same area.  Has had for several months.  No change with time.  5.  Mammogram:  Has not been done yet--was ordered at beginning of pandemic.  Apparently when being set up recently, patient mentioned some discomfort in her right breast, so switched to diagnostic mammogram via BCCCP  Current Meds  Medication Sig  . acetaminophen (TYLENOL) 500 MG tablet Take 1 tablet (500 mg total) by mouth every 6 (six) hours as needed.  . cetirizine (ZYRTEC) 10 MG tablet Take 1 tablet (10 mg total) by mouth daily.  Marland Kitchen lisinopril-hydrochlorothiazide (ZESTORETIC) 10-12.5 MG tablet Take 1 tablet by mouth daily.  . Multiple Vitamin (MULTI-VITAMIN) tablet Take by mouth daily.    Allergies  Allergen Reactions  . Naproxen Swelling    Eyes swelled with Naproxen      Review of Systems    Objective:   BP 130/80 (BP Location: Left Arm, Patient Position: Sitting, Cuff Size: Normal)   Pulse 72   Resp 12   Ht 4\' 8"  (1.422 m)    Wt 129 lb (58.5 kg)   BMI 28.92 kg/m   Physical Exam  NAD Lungs:  CTA CV:  RRR without murmur or rub.  Radial pulses normal and equal LE:  No edema Chest wall:  2.5 cm soft tissue mass at SQ level perhaps at 4-5 rib level just lateral to right scapula.  Moves easily and with minimal tenderness.  No overlying skin abnormality. Perhaps a thickening in same area on left, but no defined mass.  Extends to overly lateral scapula on left, though NT once palpating scapular area.  Tender with thickening over rib her--again would estimate this is the 4th or 5th rib and posterior.   Assessment & Plan  1.  Hypercholesterolemia:  Has made substantial changes to diet and physical activity for 10 lb weight loss. FLP  2.  Hypertension:  Control is fine.  As in #1.  Has had hyperkalemia in past with Lisinopril alone and so placed on combination Lisinopril/HCTZ.  Repeat BMP today.  3.  Lipoma of posterior right chest wall.  Suspect muscular/tendinous inflammation on left with no defined mass, but will check rib xray to see if any underlying bony changes to suggest more concerning process.  4.  Right breast discomfort--switched by BCCCP to diagnostic breast imaging for next month.  5.  HM:  Pneunococcal 13.  23 valent vaccine in 1 year.  6.  Allergies:  To restart Nasonex OTC and go back to MAP to apply for continuation.  To use daily during her allergy seasons.

## 2019-07-24 LAB — BASIC METABOLIC PANEL
BUN/Creatinine Ratio: 37 — ABNORMAL HIGH (ref 12–28)
BUN: 23 mg/dL (ref 8–27)
CO2: 20 mmol/L (ref 20–29)
Calcium: 10.2 mg/dL (ref 8.7–10.3)
Chloride: 104 mmol/L (ref 96–106)
Creatinine, Ser: 0.63 mg/dL (ref 0.57–1.00)
GFR calc Af Amer: 107 mL/min/{1.73_m2} (ref >59)
GFR calc non Af Amer: 92 mL/min/{1.73_m2} (ref >59)
Glucose: 96 mg/dL (ref 65–99)
Potassium: 5.6 mmol/L — ABNORMAL HIGH (ref 3.5–5.2)
Sodium: 142 mmol/L (ref 134–144)

## 2019-07-24 LAB — LIPID PANEL W/O CHOL/HDL RATIO
Cholesterol, Total: 224 mg/dL — ABNORMAL HIGH (ref 100–199)
HDL: 73 mg/dL (ref >39)
LDL Chol Calc (NIH): 139 mg/dL — ABNORMAL HIGH (ref 0–99)
Triglycerides: 68 mg/dL (ref 0–149)
VLDL Cholesterol Cal: 12 mg/dL (ref 5–40)

## 2019-07-29 ENCOUNTER — Other Ambulatory Visit: Payer: Self-pay | Admitting: Internal Medicine

## 2019-07-29 ENCOUNTER — Ambulatory Visit
Admission: RE | Admit: 2019-07-29 | Discharge: 2019-07-29 | Disposition: A | Discharge status: Home / Self Care | Payer: Self-pay | Source: Ambulatory Visit | Attending: Internal Medicine | Admitting: Internal Medicine

## 2019-07-29 DIAGNOSIS — R0781 Pleurodynia: Secondary | ICD-10-CM

## 2019-08-04 ENCOUNTER — Encounter (HOSPITAL_COMMUNITY): Payer: Self-pay

## 2019-08-04 ENCOUNTER — Ambulatory Visit (HOSPITAL_COMMUNITY)
Admission: RE | Admit: 2019-08-04 | Discharge: 2019-08-04 | Disposition: A | Discharge status: Home / Self Care | Payer: No Typology Code available for payment source | Source: Ambulatory Visit | Attending: Obstetrics and Gynecology | Admitting: Obstetrics and Gynecology

## 2019-08-04 ENCOUNTER — Other Ambulatory Visit: Payer: Self-pay

## 2019-08-04 DIAGNOSIS — N644 Mastodynia: Secondary | ICD-10-CM | POA: Insufficient documentation

## 2019-08-04 DIAGNOSIS — Z1239 Encounter for other screening for malignant neoplasm of breast: Secondary | ICD-10-CM

## 2019-08-04 NOTE — Progress Notes (Signed)
Complaints of right breast pain x one year that radiates from the nipple area to the outer breast. Patient states the pain comes and goes. Patient rates the pain at a 4 out of 10. Patient complained of a right breast lump x 20 years that has increased in size.  Pap Smear: Pap smear not completed today. Last Pap smear was in 2010 at [facility] and normal per patient. Per patient has a history of an abnormal Pap smear 30 years ago that a [facility]C was completed for follow-up. Patient has a history of a hysterectomy in 1992 due to fibroids. Patient doesn't need any further Pap smears due to her history of a hysterectomy for benign reasons per BCCCP and ACOG guidelines. No Pap smear results are in Epic.  Physical exam: Breasts Right breast larger than left breast that per patient is normal for her. No skin abnormalities bilateral breasts. No nipple retraction bilateral breasts. No nipple discharge bilateral breasts. No lymphadenopathy. No lumps palpated bilateral breasts. Unable to palpate a lump in patients area of concern within the right breast. Complaints of diffuse right breast pain on exam. Referred patient to the [facility] for a diagnostic mammogram. Appointment scheduled for Wednesday, August 05, 2019 at 1010.        Pelvic/Bimanual No Pap smear completed today since patient has a history of a hysterectomy for benign reasons. Pap smear not indicated per BCCCP guidelines.   Smoking History: Patient has never smoked.  Patient Navigation: Patient education provided. Access to services provided for patient through [facility] program. Spanish interpreter provided.   Colorectal Cancer Screening: Per patient has never had a colonoscopy completed. Patient completed a FOBT Test 02/03/2019 that was negative. No complaints today.   Breast and Cervical Cancer Risk Assessment: Patient has no family history of breast cancer, known genetic mutations, or radiation treatment to the chest  before age 64. Per patient has a history of cervical dysplasia. Patient has no history of being immunocompromised or DES exposure in-utero.  Risk Assessment    Risk Scores      08/04/2019   Last edited by: Loletta Parish, RN   5-year risk:    Lifetime risk:          Used Spanish interpreter Rudene Anda from [place].

## 2019-08-04 NOTE — Patient Instructions (Signed)
Explained breast self awareness with [employer]. Patient did not need a Pap smear today due to patient has a history of a hysterectomy for benign reasons. Let patient know that she doesn't need any further Pap smears due to her history of a hysterectomy for benign reasons. Referred patient to the [facility] for a diagnostic mammogram. Appointment scheduled for Wednesday, August 05, 2019 at 1010. Patient aware of appointment and will be there. Becky Holland verbalized understanding.  Katelan Hirt, Arvil Chaco, RN 3:54 PM

## 2019-08-05 ENCOUNTER — Ambulatory Visit: Payer: Self-pay

## 2019-08-05 ENCOUNTER — Ambulatory Visit
Admission: RE | Admit: 2019-08-05 | Discharge: 2019-08-05 | Disposition: A | Discharge status: Home / Self Care | Payer: No Typology Code available for payment source | Source: Ambulatory Visit | Attending: Obstetrics and Gynecology | Admitting: Obstetrics and Gynecology

## 2019-08-05 DIAGNOSIS — N644 Mastodynia: Secondary | ICD-10-CM

## 2019-08-13 ENCOUNTER — Other Ambulatory Visit (INDEPENDENT_AMBULATORY_CARE_PROVIDER_SITE_OTHER): Payer: Self-pay

## 2019-08-13 ENCOUNTER — Other Ambulatory Visit: Payer: Self-pay

## 2019-08-13 DIAGNOSIS — E875 Hyperkalemia: Secondary | ICD-10-CM

## 2019-08-14 LAB — BASIC METABOLIC PANEL
BUN/Creatinine Ratio: 21 (ref 12–28)
BUN: 13 mg/dL (ref 8–27)
CO2: 24 mmol/L (ref 20–29)
Calcium: 9.4 mg/dL (ref 8.7–10.3)
Chloride: 90 mmol/L — ABNORMAL LOW (ref 96–106)
Creatinine, Ser: 0.63 mg/dL (ref 0.57–1.00)
GFR calc Af Amer: 107 mL/min/{1.73_m2} (ref >59)
GFR calc non Af Amer: 92 mL/min/{1.73_m2} (ref >59)
Glucose: 94 mg/dL (ref 65–99)
Potassium: 5.1 mmol/L (ref 3.5–5.2)
Sodium: 124 mmol/L — ABNORMAL LOW (ref 134–144)

## 2019-10-24 ENCOUNTER — Ambulatory Visit: Payer: No Typology Code available for payment source | Attending: Internal Medicine

## 2019-10-24 DIAGNOSIS — Z23 Encounter for immunization: Secondary | ICD-10-CM | POA: Insufficient documentation

## 2019-10-24 NOTE — Progress Notes (Signed)
   Covid-19 Vaccination Clinic  Name:  Becky Holland    MRN: [id number] DOB: [birth year]  10/24/2019  [name] was observed post Covid-19 immunization for 15 minutes without incidence. She was provided with Vaccine Information Sheet and instruction to access the V-Safe system.   [name] was instructed to call 911 with any severe reactions post vaccine: Marland Kitchen Difficulty breathing  . Swelling of your face and throat  . A fast heartbeat  . A bad rash all over your body  . Dizziness and weakness    Immunizations Administered    Name Date Dose VIS Date Route   Pfizer COVID-19 Vaccine 10/24/2019 12:01 PM 0.3 mL 08/14/2019 Intramuscular   Manufacturer: [place]   Lot: [id number]   [place]: [id number]

## 2019-11-17 ENCOUNTER — Ambulatory Visit: Payer: Self-pay | Attending: Internal Medicine

## 2019-11-17 DIAGNOSIS — Z23 Encounter for immunization: Secondary | ICD-10-CM

## 2019-11-17 NOTE — Progress Notes (Signed)
   Covid-19 Vaccination Clinic  Name:  Becky Holland    MRN: [id number] DOB: Apr 29, 1951  11/17/2019  Becky Holland was observed post Covid-19 immunization for 15 minutes without incident. She was provided with Vaccine Information Sheet and instruction to access the V-Safe system.   Becky Holland was instructed to call 911 with any severe reactions post vaccine: Marland Kitchen Difficulty breathing  . Swelling of face and throat  . A fast heartbeat  . A bad rash all over body  . Dizziness and weakness   Immunizations Administered    Name Date Dose VIS Date Route   Pfizer COVID-19 Vaccine 11/17/2019 11:58 AM 0.3 mL 08/14/2019 Intramuscular   Manufacturer: [place]   Lot: [id number]   [place]: [id number]

## 2019-12-23 ENCOUNTER — Ambulatory Visit: Payer: Self-pay | Admitting: Internal Medicine

## 2019-12-23 ENCOUNTER — Encounter: Payer: Self-pay | Admitting: Internal Medicine

## 2019-12-23 ENCOUNTER — Other Ambulatory Visit: Payer: Self-pay

## 2019-12-23 VITALS — BP 122/78 | HR 82 | Resp 12 | Ht <= 58 in | Wt 126.0 lb

## 2019-12-23 DIAGNOSIS — K602 Anal fissure, unspecified: Secondary | ICD-10-CM

## 2019-12-23 DIAGNOSIS — K648 Other hemorrhoids: Secondary | ICD-10-CM | POA: Insufficient documentation

## 2019-12-23 MED ORDER — HYDROCORTISONE ACETATE 25 MG RE SUPP: 25.0000 mg | Freq: Two times a day (BID) | RECTAL | 0 refills | Status: DC

## 2019-12-23 NOTE — Progress Notes (Signed)
    Subjective:    Patient ID: Becky Holland, female   DOB: 1951-07-06, 69 y.o.   MRN: [id number]   HPI   Interpreted by Theodis Aguas  One month ago, she noted blood on tissue with wiping following BMs intermittently.  Has become more frequent.  No blood in stool or outside of stool--only on tissue after wiping.  She has had sense of inflammation of her anal area at times--particularly this past Saturday, 4 days ago.  Stools can be of small caliber.  No ribbon-like stools.  No melena.   Has not tried any over the counter remedies. Stools are soft.   No diarrhea.   Has not had this before. May have had constipation, however, before her symptoms started.  Has never had a colonoscopy  Current Meds  Medication Sig  . acetaminophen (TYLENOL) 500 MG tablet Take 1 tablet (500 mg total) by mouth every 6 (six) hours as needed.  . cetirizine (ZYRTEC) 10 MG tablet Take 1 tablet (10 mg total) by mouth daily.  Marland Kitchen lisinopril-hydrochlorothiazide (ZESTORETIC) 10-12.5 MG tablet Take 1 tablet by mouth daily.  . Multiple Vitamin (MULTI-VITAMIN) tablet Take by mouth daily.    Allergies  Allergen Reactions  . Naproxen Swelling    Eyes swelled with Naproxen      Review of Systems    Objective:   BP 122/78 (BP Location: Left Arm, Patient Position: Sitting, Cuff Size: Normal)   Pulse 82   Resp 12   Ht 4\' 8"  (1.422 m)   Wt 126 lb (57.2 kg)   BMI 28.25 kg/m   Physical Exam  NAD Abd:  S, NT, No HSM or mass, + BS Rectal/anoscopy:  2 areas of fissuring underlying mild to moderated beefy looking internal hemorrhoid.  At about 7 O'clock.  No mass.  Quiescent external anal tags   Assessment & Plan  1.  Fissuring with internal hemorrhoids:  Discussed gentle treatment of the area--avoid forceful wiping.   Would recommend soft washcloth to clean gently, followed by tucks and then Anusol HC supp twice daily for next 3 days or so. No antibacterial or other harsh soaps Metamucil daily with  increased water to avoid hard or loose stools.

## 2019-12-23 NOTE — Patient Instructions (Signed)
Tome mucho agua Tome Metamucil una vez al dia con 8 ounces agua.  Obtenir Tucks [employer] Hazel pads)

## 2019-12-25 ENCOUNTER — Ambulatory Visit: Payer: Self-pay | Admitting: Internal Medicine

## 2020-01-21 ENCOUNTER — Encounter: Payer: Self-pay | Admitting: Internal Medicine

## 2020-01-21 ENCOUNTER — Ambulatory Visit (INDEPENDENT_AMBULATORY_CARE_PROVIDER_SITE_OTHER): Payer: Self-pay | Admitting: Internal Medicine

## 2020-01-21 ENCOUNTER — Other Ambulatory Visit: Payer: Self-pay

## 2020-01-21 VITALS — BP 120/72 | HR 82 | Resp 12 | Ht <= 58 in | Wt 122.0 lb

## 2020-01-21 DIAGNOSIS — Z1159 Encounter for screening for other viral diseases: Secondary | ICD-10-CM

## 2020-01-21 DIAGNOSIS — Z780 Asymptomatic menopausal state: Secondary | ICD-10-CM

## 2020-01-21 DIAGNOSIS — K625 Hemorrhage of anus and rectum: Secondary | ICD-10-CM

## 2020-01-21 DIAGNOSIS — Z0000 Encounter for general adult medical examination without abnormal findings: Secondary | ICD-10-CM

## 2020-01-21 DIAGNOSIS — Z23 Encounter for immunization: Secondary | ICD-10-CM

## 2020-01-21 MED ORDER — CETIRIZINE HCL 10 MG PO TABS: 10.0000 mg | ORAL_TABLET | Freq: Every day | ORAL | 11 refills | Status: DC

## 2020-01-21 MED ORDER — LISINOPRIL-HYDROCHLOROTHIAZIDE 10-12.5 MG PO TABS: 1.0000 | ORAL_TABLET | Freq: Every day | ORAL | 11 refills | Status: DC

## 2020-01-21 NOTE — Progress Notes (Signed)
Subjective:    Patient ID: Becky Holland, female   DOB: 14-Mar-1951, 69 y.o.   MRN: [id number]   HPI   CPE without pap  1.  Pap:  History of abnormal pap in her 64s with conization and subsequent normalization.  History of hysterectomy for fibroids later in life.  No pap for over 9 years.  Does have one ovary.    2.  Mammogram:  Done 08/05/2019.  No family history of breast cancer.  She does have a history of benign breast mass when age 60 yo.  3.  Osteoprevention:  Only able to tolerate 1 cup of Lactaid milk daily--still gets diarrhea if more.  Was taking calcium pills (caltrate with D) until about 3 months ago, but stopped as her lips were itching.  Itching resolved.  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   Also chasing her grandchildren around.  4.  Guaiac Cards:  Negative 02/2019.  5.  Colonoscopy:  Never.  No family history of colon cancer.  Continues to have some difficulty with blood in stool.  Does have history of hemorrhoids and fissuring in past.  Notes BRB on tissue when cleans.  Not every BM.  Stools soft and easy to pass.   6.  Immunizations:  Will need Pneumococcal 23 in 07/2019.  She is due for Pneumococcal 23 in 07/2020.  She has not had a tetanus vaccine in over 20 years.    7.  Glucose/Cholesterol:  Fasting glucose fine.  Cholesterol total mildly high, but with a very high HDL.  Lipid Panel     Component Value Date/Time   CHOL 224 (H) 07/23/2019 1024   TRIG 68 07/23/2019 1024   HDL 73 07/23/2019 1024   LDLCALC 139 (H) 07/23/2019 1024   LABVLDL 12 07/23/2019 1024     Current Meds  Medication Sig  . acetaminophen (TYLENOL) 500 MG tablet Take 1 tablet (500 mg total) by mouth every 6 (six) hours as needed.  . cetirizine (ZYRTEC) 10 MG tablet Take 1 tablet (10 mg total) by mouth daily.  Marland Kitchen lisinopril-hydrochlorothiazide (ZESTORETIC) 10-12.5 MG tablet Take 1 tablet by mouth daily.  . mometasone (NASONEX) 50 MCG/ACT  nasal spray 2 sprays each nostril daily.  . Multiple Vitamin (MULTI-VITAMIN) tablet Take by mouth daily.    Allergies  Allergen Reactions  . Naproxen Swelling    Eyes swelled with Naproxen     Past Medical History:  Diagnosis Date  . Hypertension 2019   Past Surgical History:  Procedure Laterality Date  . ABDOMINAL HYSTERECTOMY  1992   For fibroid tumor; also left Oophorectomy.    Marland Kitchen BREAST BIOPSY     Benign--about age 19  . CERVICAL CONE BIOPSY     probably around age 21 or 76 for precancerous cells  . EYE SURGERY Right 05/2018   [place]  . EYE SURGERY Left 07/2018   Dr  Lourena Simmonds are cataract extractions    Family History  Problem Relation Age of Onset  . Stroke Mother   . 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 Stroke Brother 64  . Hypertension Brother     Social History   Socioeconomic History  . Marital status: Widowed    Spouse name: Not on file  . Number of children: 3  . Years of education: Not on file  . Highest education level: 6th grade  Occupational History  . Occupation: business Education administrator  Tobacco Use  . Smoking status: Former Research scientist (life sciences)  . Smokeless tobacco: Never Used  Substance and Sexual Activity  . Alcohol use: Not Currently    Comment: History of overuse many years ago when husband left her.  Rare now.    . Drug use: Never  . Sexual activity: Not Currently  Other Topics Concern  . Not on file  Social History Narrative   Husband left her though never formally divorced.     He died in early 28s   Lives with her daughter and her husband and 3 grandchildren.   Social Determinants of Health   Financial Resource Strain:   . Difficulty of Paying Living Expenses:   Food Insecurity: No Food Insecurity  . Worried About Charity fundraiser in the Last Year: Never true  . Ran Out of Food in the Last Year: Never true  Transportation Needs: No Transportation Needs  . Lack of Transportation (Medical): No  . Lack  of Transportation (Non-Medical): No  Physical Activity:   . Days of Exercise per Week:   . Minutes of Exercise per Session:   Stress:   . Feeling of Stress :   Social Connections:   . Frequency of Communication with Friends and Family:   . Frequency of Social Gatherings with Friends and Family:   . Attends Religious Services:   . Active Member of Clubs or Organizations:   . Attends Archivist Meetings:   Marland Kitchen Marital Status:   Intimate Partner Violence:   . Fear of Current or Ex-Partner:   . Emotionally Abused:   Marland Kitchen Physically Abused:   . Sexually Abused:     Review of Systems  Constitutional: Negative for appetite change and fatigue.  HENT: Positive for dental problem (Already set up with [place] [facility] and working on her concerns.) and hearing loss (Very mild on left side.  ). Negative for rhinorrhea and sore throat.   Eyes: Positive for visual disturbance (Does wear glasses.  Had cataracts removed from both eyes in 2019.  Dr. Susa Simmonds).  Respiratory: Negative for cough and shortness of breath.   Cardiovascular: Negative for chest pain, palpitations and leg swelling.  Gastrointestinal: Positive for blood in stool (has BRB on tissue frequently when wiping.). Negative for abdominal pain, constipation and diarrhea.  Genitourinary: Negative for dysuria, frequency, vaginal bleeding and vaginal discharge.  Musculoskeletal: Negative for arthralgias.  Skin: Negative for rash.  Neurological: Negative for weakness and numbness.  Psychiatric/Behavioral: Positive for dysphoric mood (At times, but short lived and currently not interested in counseling.). The patient is not nervous/anxious.       Objective:   BP 120/72 (BP Location: Left Arm, Patient Position: Sitting, Cuff Size: Normal)   Pulse 82   Resp 12   Ht 4\' 8"  (1.422 m)   Wt 122 lb (55.3 kg)   BMI 27.35 kg/m   Physical Exam  Constitutional: She is oriented to person, place, and time. She appears well-developed  and well-nourished.  HENT:  Head: Normocephalic and atraumatic.  Right Ear: Tympanic membrane, external ear and ear canal normal.  Left Ear: Tympanic membrane, external ear and ear canal normal.  Nose: Nose normal.  Mouth/Throat: Oropharynx is clear and moist and mucous membranes are normal.  Some dental decay.  Eyes: Conjunctivae and EOM are normal. Right pupil is not round. Right pupil is reactive. Left pupil is round and reactive.  Right pupil with obvious surgical changes--oblong in diagonal orientation.  Neck: No thyromegaly present.  Cardiovascular: Normal  rate, regular rhythm, S1 normal and S2 normal. Exam reveals no S3, no S4 and no friction rub.  No murmur heard. Diffuse Varicosities and spider veins of bilateral LE  Pulmonary/Chest: Effort normal and breath sounds normal. Right breast exhibits no inverted nipple, no mass, no nipple discharge, no skin change and no tenderness. Left breast exhibits no inverted nipple, no mass, no nipple discharge, no skin change and no tenderness.  Abdominal: Soft. Bowel sounds are normal. She exhibits no mass. There is no hepatosplenomegaly. There is no abdominal tenderness. No hernia.  Genitourinary:    Genitourinary Comments: Normal external female genitalia.  Absent cervix and uterus on bimanual exam.  No adnexal mass or tenderness.   Musculoskeletal:        General: No edema. Normal range of motion.     Cervical back: Full passive range of motion without pain, normal range of motion and neck supple.     Comments: Hypertrophic and varus changes to knees  Lymphadenopathy:       Head (right side): No submental and no submandibular adenopathy present.       Head (left side): No submental and no submandibular adenopathy present.    She has no cervical adenopathy.    She has no axillary adenopathy.       Right: No inguinal and no supraclavicular adenopathy present.       Left: No inguinal and no supraclavicular adenopathy present.  Neurological:  She is alert and oriented to person, place, and time. She has normal strength and normal reflexes. No cranial nerve deficit or sensory deficit. Coordination and gait normal.  Skin: Skin is warm. No rash noted.  Psychiatric: She has a normal mood and affect. Her speech is normal and behavior is normal. Judgment and thought content normal. Cognition and memory are normal.     Assessment & Plan  1.  CPE without pap Hep C screen Tdap today. Pneumococcal 23 in November or after. Mammogram not due until December Will see if can get affordable DEXA as postmenopausal and has not been consistent with calcium and vitamin D intake. Citracal 600 mg with D twice daily--to see if any problems with lip itching with this version of calcium.  2.  Hypertension:  Controlled.  CMP  3.  BRBPR:  Will see if can get diagnostic Colonoscopy.  CBC  She has had hemorrhoids and fissuring before when more active bleeding, but continues to have this intermittently.

## 2020-01-22 LAB — CBC WITH DIFFERENTIAL/PLATELET
Basophils Absolute: 0.1 10*3/uL (ref 0.0–0.2)
Basos: 1 %
EOS (ABSOLUTE): 0.1 10*3/uL (ref 0.0–0.4)
Eos: 2 %
Hematocrit: 38.5 % (ref 34.0–46.6)
Hemoglobin: 12.8 g/dL (ref 11.1–15.9)
Immature Grans (Abs): 0 10*3/uL (ref 0.0–0.1)
Immature Granulocytes: 0 %
Lymphocytes Absolute: 2.1 10*3/uL (ref 0.7–3.1)
Lymphs: 38 %
MCH: 30.9 pg (ref 26.6–33.0)
MCHC: 33.2 g/dL (ref 31.5–35.7)
MCV: 93 fL (ref 79–97)
Monocytes Absolute: 0.5 10*3/uL (ref 0.1–0.9)
Monocytes: 10 %
Neutrophils Absolute: 2.7 10*3/uL (ref 1.4–7.0)
Neutrophils: 49 %
Platelets: 400 10*3/uL (ref 150–450)
RBC: 4.14 x10E6/uL (ref 3.77–5.28)
RDW: 12.3 % (ref 11.7–15.4)
WBC: 5.5 10*3/uL (ref 3.4–10.8)

## 2020-01-22 LAB — COMPREHENSIVE METABOLIC PANEL
ALT: 25 IU/L (ref 0–32)
AST: 28 IU/L (ref 0–40)
Albumin/Globulin Ratio: 1.8 (ref 1.2–2.2)
Albumin: 4.4 g/dL (ref 3.8–4.8)
Alkaline Phosphatase: 79 IU/L (ref 48–121)
BUN/Creatinine Ratio: 29 — ABNORMAL HIGH (ref 12–28)
BUN: 22 mg/dL (ref 8–27)
Bilirubin Total: 0.4 mg/dL (ref 0.0–1.2)
CO2: 25 mmol/L (ref 20–29)
Calcium: 10.2 mg/dL (ref 8.7–10.3)
Chloride: 102 mmol/L (ref 96–106)
Creatinine, Ser: 0.75 mg/dL (ref 0.57–1.00)
GFR calc Af Amer: 94 mL/min/{1.73_m2} (ref >59)
GFR calc non Af Amer: 82 mL/min/{1.73_m2} (ref >59)
Globulin, Total: 2.4 g/dL (ref 1.5–4.5)
Glucose: 96 mg/dL (ref 65–99)
Potassium: 4.9 mmol/L (ref 3.5–5.2)
Sodium: 140 mmol/L (ref 134–144)
Total Protein: 6.8 g/dL (ref 6.0–8.5)

## 2020-01-22 LAB — HEPATITIS C ANTIBODY: Hep C Virus Ab: 0.3 s/co ratio (ref 0.0–0.9)

## 2020-04-28 ENCOUNTER — Ambulatory Visit
Admission: RE | Admit: 2020-04-28 | Discharge: 2020-04-28 | Disposition: A | Discharge status: Home / Self Care | Payer: No Typology Code available for payment source | Source: Ambulatory Visit | Attending: Internal Medicine | Admitting: Internal Medicine

## 2020-04-28 ENCOUNTER — Other Ambulatory Visit: Payer: Self-pay

## 2020-04-28 DIAGNOSIS — Z780 Asymptomatic menopausal state: Secondary | ICD-10-CM

## 2020-08-04 ENCOUNTER — Other Ambulatory Visit: Payer: Self-pay

## 2020-08-04 ENCOUNTER — Ambulatory Visit: Payer: Self-pay | Admitting: Internal Medicine

## 2020-08-04 ENCOUNTER — Encounter: Payer: Self-pay | Admitting: Internal Medicine

## 2020-08-04 VITALS — BP 125/67 | HR 88 | Resp 12 | Ht <= 58 in | Wt 124.0 lb

## 2020-08-04 DIAGNOSIS — K029 Dental caries, unspecified: Secondary | ICD-10-CM

## 2020-08-04 DIAGNOSIS — K625 Hemorrhage of anus and rectum: Secondary | ICD-10-CM

## 2020-08-04 DIAGNOSIS — Z1231 Encounter for screening mammogram for malignant neoplasm of breast: Secondary | ICD-10-CM

## 2020-08-04 DIAGNOSIS — I10 Essential (primary) hypertension: Secondary | ICD-10-CM

## 2020-08-04 DIAGNOSIS — Z23 Encounter for immunization: Secondary | ICD-10-CM

## 2020-08-04 DIAGNOSIS — K219 Gastro-esophageal reflux disease without esophagitis: Secondary | ICD-10-CM

## 2020-08-04 MED ORDER — FAMOTIDINE 20 MG PO TABS: ORAL_TABLET | ORAL | 2 refills | Status: DC

## 2020-08-04 NOTE — Patient Instructions (Addendum)
Enfermedad de reflujo gastroesofgico en los adultos Gastroesophageal Reflux Disease, Adult El reflujo gastroesofgico (RGE) ocurre cuando el cido del estmago sube por el tubo que conecta la boca con el estmago (esfago). Normalmente, la comida baja por el esfago y se mantiene en el estmago, donde se la digiere. Sin embargo, cuando una persona tiene [place], los alimentos y el cido estomacal suelen volver al esfago. Si esto se vuelve un problema ms grave, a la persona se le puede diagnosticar una enfermedad llamada enfermedad de reflujo gastroesofgico (ERGE). La ERGE ocurre cuando el reflujo:  Sucede a menudo.  Causa sntomas frecuentes o graves.  Causa problemas tales como dao en el esfago. Cuando el cido del Insurance claims handler en contacto con el esfago, el cido puede provocar dolor (inflamacin) en el esfago. Con el tiempo, pueden formarse pequeos agujeros (lceras) en el revestimiento del esfago. Cules son las causas? Esta afeccin se debe a un problema en el msculo que se encuentra entre el esfago y Product manager (esfnter esofgico inferior, o EEI). Normalmente, el EEI se cierra una vez que la comida pasa a travs del esfago hasta el [place]. Cuando el EEI se encuentra debilitado o tiene alguna anomala, no se cierra por completo, y eso permite que tanto la comida como el jugo gstrico, que es cido, [place] a subir por el esfago. El EEI puede debilitarse a causa de ciertas sustancias alimenticias, medicamentos y Product/process development scientist, que incluyen:  El consumo de [place].  [place].  Tener una hernia de hiato.  Consumo de alcohol.  Ciertos alimentos y bebidas, como caf, chocolate, cebollas y [place]. Qu incrementa el riesgo? Es ms probable que tenga esta afeccin si:  Tiene un aumento del Engineer, site.  Tiene un trastorno del tejido conjuntivo.  [place] antiinflamatorios no esteroideos (AINE). Cules son los signos o los sntomas? Los sntomas de esta afeccin  incluyen:  Acidez estomacal.  Dificultad o dolor al tragar.  Sensacin de Best boy un bulto en la garganta.  Sabor amargo en la boca.  Mal aliento.  Gran cantidad de saliva.  Estmago inflamado o con Tree surgeon.  Eructos.  Dolor en el pecho. El dolor de pecho puede deberse a distintas afecciones. Es importante que consulte al mdico si tiene dolor de [place].  Dificultad para respirar o sibilancias.  Tos constante (crnica) o tos nocturna.  Desgaste del Paramedic.  Prdida de peso. Cmo se diagnostica? El mdico le har una historia clnica y un examen fsico. Para determinar si tiene ERGE leve o grave, el mdico tambin puede controlar cmo usted reacciona al tratamiento. Tambin pueden [employer], que [place] los siguientes:  Un estudio para examinarle el [place] y el esfago con una cmara pequea (endoscopa).  Una prueba para medir el grado de Musician.  Una prueba para medir cunta presin hay en el esfago.  Un estudio de deglucin con bario comn o modificado para ver la forma, el tamao y el funcionamiento del esfago. Cmo se trata? El [place] del tratamiento es ayudar a Public house manager los sntomas y Product/process development scientist las complicaciones. El tratamiento de esta afeccin puede variar segn la gravedad de los sntomas. El mdico puede recomendarle lo siguiente:  Cambios en la dieta.  Medicamentos.  Clementeen Hoof. Siga estas indicaciones en su casa: Comida y bebida   Siga la dieta recomendada por el mdico. Esto puede incluir evitar ciertos alimentos y bebidas, por ejemplo: ? Caf y t (con o sin cafena). ? Bebidas que contengan alcohol. ? Bebidas energticas y deportivas. ? Bebidas gaseosas o  refrescos. ? Chocolate y cacao. ? Menta y [place]. ? Ajo y cebolla. ? Rbano picante. ? Alimentos condimentados, picantes y cidos, por ejemplo, todos los tipos de pimientas, [place] en polvo, curry en polvo, vinagre, salsas picantes y Omnicom. ? Ctricos y sus jugos, por ejemplo, naranjas, limones y limas. ? Alimentos a base de tomate, como salsa de [place], [place], salsa picante y pizza con salsa de [place]. ? Alimentos fritos y [place], [place] donas, papas fritas y aderezos ricos en grasas. ? Carnes con alto contenido de grasa, como salchichas, y cortes de carnes rojas y blancas con mucha grasa, por ejemplo, chuletas o costillas, embutidos, jamn y tocino. ? Productos lcteos ricos en grasas, como leche [place], [place] y [place] crema.  Haga comidas pequeas y frecuentes Medical sales representative de comidas abundantes.  Evite beber grandes cantidades de lquidos con las comidas.  Evite comer 2 o 3horas antes de acostarse.  Evite recostarse inmediatamente despus de comer.  No haga ejercicios enseguida despus de comer. Estilo de vida   No consuma ningn producto que contenga nicotina o tabaco, como cigarrillos, cigarrillos electrnicos y tabaco de Higher education careers adviser. Si necesita ayuda para dejar de fumar, consulte al [employer].  Trate de reducir el estrs con mtodos como el yoga o la meditacin. Si necesita ayuda para reducir [employer] de estrs, consulte al mdico.  Si tiene sobrepeso, baje hasta llegar a un peso saludable para usted. Pdale consejos al mdico para bajar de peso de [place] segura. Indicaciones generales  Est atento a cualquier cambio en los sntomas.  Tome los medicamentos de venta libre y los recetados solamente como se lo haya indicado el mdico. No tome aspirina, ibuprofeno ni otros AINE a menos que el mdico se lo indique.  Use ropa holgada. No use nada apretado alrededor de la cintura que haga presin sobre el abdomen.  Levante (eleve) la cabecera de la cama aproximadamente 6pulgadas (15cm).  Evite inclinarse si al hacerlo empeoran los sntomas.  Concurra a todas las visitas de seguimiento como se lo haya indicado el mdico. Esto es importante. Comunquese con un mdico si:  Tiene los siguientes  sntomas: ? Sntomas nuevos. ? Prdida de peso sin causa aparente. ? Dificultad o dolor al tragar. ? Sibilancias o una tos persistente. ? Voz ronca.  Los sntomas no mejoran con Dispensing optician. Solicite ayuda inmediatamente si:  [employer], el cuello, la [place], los dientes o la espalda.  Se siente transpirado, mareado o tiene una sensacin de desvanecimiento.  Siente dolor intenso en el pecho o le falta el aire.  Vomita y el vmito tiene un aspecto similar a la sangre o a los posos de caf.  Se desmaya.  Tiene heces sanguinolentas o negras.  No puede tragar, beber o comer. Resumen  El reflujo gastroesofgico ocurre cuando el cido del estmago sube al esfago. La ERGE es una enfermedad en la que el reflujo ocurre con frecuencia, causa sntomas frecuentes o graves, o causa problemas tales como dao en el esfago.  El tratamiento de esta afeccin puede variar segn la gravedad de los sntomas. El mdico puede indicarle que siga una dieta y haga cambios en su estilo de vida, tome medicamentos o se someta a [place].  Comunquese con un mdico si tiene sntomas nuevos o los sntomas empeoran.  Tome los medicamentos de venta libre y los recetados solamente como se lo haya indicado el mdico. No tome aspirina, ibuprofeno ni otros AINE a menos que el  mdico se lo indique.  Concurra a todas las visitas de seguimiento como se lo haya indicado el mdico. Esto es importante. Esta informacin no tiene Marine scientist el consejo del mdico. Asegrese de hacerle al mdico cualquier pregunta que tenga. Document Revised: 04/03/2018 Document Reviewed: 04/03/2018 Elsevier Patient Education  2020 [employer].  Call the clinic if you redevelop symptoms after stopping the medication for reflux

## 2020-08-04 NOTE — Progress Notes (Signed)
° ° °  Subjective:    Patient ID: Becky Holland, female   DOB: 1951-04-12, 69 y.o.   MRN: [id number]   HPI   Rush Barer interprets  1.  Hypertension:  Taking Lisinopril-HCTZ regularly.  No problems.  2.  Acid reflux:  Started perhaps in June 2021.  When belches, has hot fluid up her chest and into her throat.  Belches a lot when having reflux.   When she eats spicy food or tomatoes, she has symptoms.  Has symptoms 2 days out of the week on average. Rare wine drinking. Eats onions, but not daily. At times, drinks hot chocolate--sounds fairly rare.  Has been drinking more recently.  She did have GI disturbance with gas, but no increased GERD symptoms. She drink coffee twice daily.  Large cups.   Not much in way of sodas/colas/caffeine containing drinks otherwise. No tobacco use.   She does lie down sometimes after eating--10 to 15 minutes afterward.   No melena or hematochezia.   Does not feel she has had any additional stress, even with the pandemic.  3.  BRBPR:  Missed her appt with GI, so cannot reschedule again until August of 2022.    4.  Mammogram:  She has received her letter to schedule, but has not done so.    Current Meds  Medication Sig   acetaminophen (TYLENOL) 500 MG tablet Take 1 tablet (500 mg total) by mouth every 6 (six) hours as needed.   cetirizine (ZYRTEC) 10 MG tablet Take 1 tablet (10 mg total) by mouth daily.   lisinopril-hydrochlorothiazide (ZESTORETIC) 10-12.5 MG tablet Take 1 tablet by mouth daily.   mometasone (NASONEX) 50 MCG/ACT nasal spray 2 sprays each nostril daily.   Multiple Vitamin (MULTI-VITAMIN) tablet Take by mouth daily.    Allergies  Allergen Reactions   Naproxen Swelling    Eyes swelled with Naproxen      Review of Systems    Objective:   BP 125/67 (BP Location: Left Arm, Patient Position: Sitting, Cuff Size: Normal)    Pulse 88    Resp 12    Ht 4\' 8"  (1.422 m)    Wt 124 lb (56.2 kg)    BMI 27.80 kg/m    Physical Exam  NAD HEENT:  PERRL, EOMI, Dental decay Neck:  Supple, No adenopathy Chest:  CTA CV:  RRR without murmur or rub.  Radial and DP pulses normal and equal.   Abd:  S, NT over epigastric area as well as rest of abdomen.  + BS, No HSM or mass. LE:  No edema.  Assessment & Plan   Hypertension:  controlled with current medication.    2.  Dental Decay:  Cancelled her dental appt as could not make the time.  Has not rescheduled  3.  GERD:  Famotidine 40 mg at bedtime.  GERD precautions discussed.    4.  HM:  Rx for influenza at [facility] clear they will be have any left. Pneumovax 23 v Up to date with COVID vaccination.   Encouraged shingle vaccination as can afford at pharmacy of choice Mammogram ordered.

## 2020-09-28 ENCOUNTER — Other Ambulatory Visit: Payer: Self-pay

## 2020-09-28 ENCOUNTER — Ambulatory Visit
Admission: RE | Admit: 2020-09-28 | Discharge: 2020-09-28 | Disposition: A | Discharge status: Home / Self Care | Payer: No Typology Code available for payment source | Source: Ambulatory Visit | Attending: Internal Medicine | Admitting: Internal Medicine

## 2020-09-28 ENCOUNTER — Telehealth: Payer: Self-pay | Admitting: Internal Medicine

## 2020-09-28 DIAGNOSIS — Z1231 Encounter for screening mammogram for malignant neoplasm of breast: Secondary | ICD-10-CM

## 2020-09-28 NOTE — Telephone Encounter (Signed)
Patient was referred to [facility] for a mammogram. Patient was schedule an appointment for today 09/28/2020. Patient attended her appointment, but when she arrived she was informed that they do not accept the [place] there. Patient left and received a call from a person named Rayn that informed her of a confusion where the personnel at [facility] thought the she was been see for a bone density and not for a mammogram. Patient needs help clarifying the confusion. Please advise.

## 2020-09-29 ENCOUNTER — Telehealth: Payer: Self-pay | Admitting: Internal Medicine

## 2020-09-29 NOTE — Telephone Encounter (Signed)
[place], I called [facility] [phone] and spoke to Sandy Level.  She states the patient scheduled the mammogram on her own and that is why she was told that they do not take the [facility] card. Patient was given the mammogram scholarship application to fill out so that she may get her mammogram through the [facility] program.   We also have here the [place] application  that patient needs to come in and complete so that we can fax it over to the [facility] program and pt. Can get it free of charge . Once pt. Comes in to complete application, please fax it over to [phone] along with the order place by Dr. Amil Amen. Please let the patient know someone will be reaching out to get to set up an appointment.  Thank you.

## 2020-09-29 NOTE — Telephone Encounter (Signed)
Spoke with patient and she will be coming to the office on 10/03/2020 or 10/04/2020 in the morning to fill out the [facility] scholarship application.

## 2020-10-05 NOTE — Telephone Encounter (Signed)
Spoke with patient and remind her that she needs to come to the office to fill out application for [facility] program. Patient stated that she will be coming this week to do it.

## 2020-10-11 ENCOUNTER — Encounter: Payer: Self-pay | Admitting: Clinical

## 2020-10-11 NOTE — Progress Notes (Signed)
Case management note:  Patient arrived to clinic seeking assistance to fill out scholarship form. LCSW assisted patient with filling mammogram scholarship and provided to receptionist to complete referral.

## 2020-10-13 ENCOUNTER — Ambulatory Visit: Payer: No Typology Code available for payment source

## 2020-10-17 NOTE — Telephone Encounter (Signed)
Patient came and fill out application. Application was faxed on 10/11/2020

## 2020-12-01 ENCOUNTER — Ambulatory Visit
Admission: RE | Admit: 2020-12-01 | Discharge: 2020-12-01 | Disposition: A | Discharge status: Home / Self Care | Payer: No Typology Code available for payment source | Source: Ambulatory Visit | Attending: Internal Medicine | Admitting: Internal Medicine

## 2020-12-01 ENCOUNTER — Other Ambulatory Visit: Payer: Self-pay

## 2020-12-01 DIAGNOSIS — Z1231 Encounter for screening mammogram for malignant neoplasm of breast: Secondary | ICD-10-CM

## 2020-12-05 ENCOUNTER — Other Ambulatory Visit: Payer: Self-pay | Admitting: Obstetrics and Gynecology

## 2020-12-05 DIAGNOSIS — R928 Other abnormal and inconclusive findings on diagnostic imaging of breast: Secondary | ICD-10-CM

## 2020-12-06 ENCOUNTER — Telehealth: Payer: Self-pay | Admitting: Internal Medicine

## 2020-12-06 NOTE — Telephone Encounter (Signed)
Patient called to let you know that she received a call from the office where she got the mammogram and was told that they find something and needs to apply for the Pink Card to continue her follow up. Patient would like to know if you can please explained to her what were the results of her mammogram and where to obtain the [place].

## 2020-12-11 NOTE — Telephone Encounter (Signed)
There was what they call asymmetry on the left breast--something that just did not look exactly right.  They want to do more views and are asking her to sign up for the pink card so she can get the mammograms without cost for her.  The BCCCP program should have called her to set that up.  Regardless, she needs to do the follow up views.

## 2020-12-15 NOTE — Telephone Encounter (Signed)
Spoke with patient and she confirmed that she already received the call from [facility]. She will be applying for her pink card. I reminded the patient to let us know if she has any additional questions or if her follow ups are not scheduled.

## 2020-12-29 ENCOUNTER — Ambulatory Visit
Admission: RE | Admit: 2020-12-29 | Discharge: 2020-12-29 | Disposition: A | Discharge status: Home / Self Care | Payer: No Typology Code available for payment source | Source: Ambulatory Visit | Attending: Obstetrics and Gynecology | Admitting: Obstetrics and Gynecology

## 2020-12-29 ENCOUNTER — Other Ambulatory Visit: Payer: Self-pay

## 2020-12-29 ENCOUNTER — Ambulatory Visit: Payer: No Typology Code available for payment source | Admitting: *Deleted

## 2020-12-29 ENCOUNTER — Ambulatory Visit: Payer: No Typology Code available for payment source

## 2020-12-29 VITALS — BP 110/78 | Wt 127.3 lb

## 2020-12-29 DIAGNOSIS — R928 Other abnormal and inconclusive findings on diagnostic imaging of breast: Secondary | ICD-10-CM

## 2020-12-29 DIAGNOSIS — Z1239 Encounter for other screening for malignant neoplasm of breast: Secondary | ICD-10-CM

## 2020-12-29 NOTE — Progress Notes (Signed)
Ms. Becky Holland is a 70 y.o. female who presents to [facility] clinic today with no complaints. Patient referred to [facility] by the [facility] due to recommending additional imaging of the left breast. Screening mammogram completed 12/01/2020.   Pap Smear: Pap smear not completed today. Last Pap smear was in 2010 at [facility] and normal per patient. Per patient has a history of an abnormal Pap smear 32 years ago that a [facility] was completed for follow-up. Patient has a history of a hysterectomy in 1992 due to fibroids. Patient doesn't need any further Pap smears due to her history of a hysterectomy for benign reasons per BCCCP and ASCCP guidelines. No Pap smear results are in Epic.   Physical exam: Breasts Right breast larger than left breast that per patient is normal for her. No skin abnormalities bilateral breasts. No nipple retraction bilateral breasts. No nipple discharge bilateral breasts. No lymphadenopathy. No lumps palpated bilateral breasts. No complaints of pain or tenderness on exam.      Pelvic/Bimanual Pap is not indicated today per BCCCP guidelines.   Smoking History: Patient has never smoked.   Patient Navigation: Patient education provided. Access to services provided for patient through [facility] program. Spanish interpreter Rudene Anda from [facility] provided.   Colorectal Cancer Screening: Per patient has never had a colonoscopy completed. Patient completed a FOBT Test 02/03/2019 that was negative. No complaints today.    Breast and Cervical Cancer Risk Assessment: Patient has no family history of breast cancer, known genetic mutations, or radiation treatment to the chest before age 5. Per patient has a history of cervical dysplasia. Patient has no history of being immunocompromised or DES exposure in-utero.  Risk Assessment    Risk Scores      12/29/2020 08/04/2019   Last edited by: Royston Bake, CMA Carmel Garfield, Heath Gold, RN   5-year risk:  1.2 %    Lifetime risk: 3.6 %           A: BCCCP exam without pap smear No complaints.  P: Referred patient to the [place] for a left breast diagnostic mammogram per recommendation. Appointment scheduled Thursday, April. 28, 2022 at 1450.  Loletta Parish, RN 12/29/2020 2:17 PM

## 2020-12-29 NOTE — Patient Instructions (Addendum)
Explained breast self awareness with [employer]. Patient did not need a Pap smear today due to patient has a history of a hysterectomy for benign reasons. Let her know that she doesn't need any further Pap smears due to her history of a hysterectomy for benign reasons. Referred patient to the [facility] for a left breast diagnostic mammogram per recommendation. Appointment scheduled Thursday, April. 28, 2022 at 1450. Patient aware of appointment and will be there. Becky Holland verbalized understanding.  Jakarri Lesko, Arvil Chaco, RN 2:17 PM

## 2021-01-12 ENCOUNTER — Telehealth: Payer: Self-pay | Admitting: Internal Medicine

## 2021-01-12 NOTE — Telephone Encounter (Signed)
Patient called requesting a refill of lisinopril-hydrochlorothiazide (ZESTORETIC) 10-12.5 MG tablet. If you can please send it to [facility] at [address].

## 2021-01-16 MED ORDER — LISINOPRIL-HYDROCHLOROTHIAZIDE 10-12.5 MG PO TABS: 1.0000 | ORAL_TABLET | Freq: Every day | ORAL | 9 refills | Status: DC

## 2021-01-19 ENCOUNTER — Encounter: Payer: Self-pay | Admitting: Internal Medicine

## 2021-01-19 ENCOUNTER — Ambulatory Visit (INDEPENDENT_AMBULATORY_CARE_PROVIDER_SITE_OTHER): Payer: Self-pay | Admitting: Internal Medicine

## 2021-01-19 ENCOUNTER — Other Ambulatory Visit: Payer: Self-pay

## 2021-01-19 VITALS — BP 100/68 | HR 89 | Resp 12 | Ht <= 58 in | Wt 125.5 lb

## 2021-01-19 DIAGNOSIS — I10 Essential (primary) hypertension: Secondary | ICD-10-CM

## 2021-01-19 DIAGNOSIS — Z23 Encounter for immunization: Secondary | ICD-10-CM

## 2021-01-19 DIAGNOSIS — Z0000 Encounter for general adult medical examination without abnormal findings: Secondary | ICD-10-CM

## 2021-01-19 DIAGNOSIS — E7800 Pure hypercholesterolemia, unspecified: Secondary | ICD-10-CM

## 2021-01-19 NOTE — Progress Notes (Signed)
Subjective:    Patient ID: Becky Holland, female   DOB: [birth year], 70 y.o.   MRN: [id number]   HPI   Raquel Sarna interprets  CPE without pap  1.  Pap:  History years ago of hysterectomy.  Had biopsy of cervix years ago, which she states was benign.    2.  Mammogram:  Mammogram in March 2022 with concern for asymmetry in left breast, but extra compression views were normal.  She is to follow up in one year with annual mammogram.  No family history of breast cancer.    3.  Osteoprevention:  She drinks 3 glasses of milk daily.  She walks all day long with work.    4.  Guaiac Cards:  Negative 02/02/2019.    5.  Colonoscopy:  Never.  No family history of colon cancer.   6.  Immunizations:  Has not had Shingles vaccine or 2nd COVID booster.  7.  Glucose/Cholesterol:  Fasting blood sugars have been fine in past and cholesterol is a bit high, but with excellent HDL.   Lipid Panel     Component Value Date/Time   CHOL 224 (H) 07/23/2019 1024   TRIG 68 07/23/2019 1024   HDL 73 07/23/2019 1024   LDLCALC 139 (H) 07/23/2019 1024   LABVLDL 12 07/23/2019 1024     Current Meds  Medication Sig   acetaminophen (TYLENOL) 500 MG tablet Take 1 tablet (500 mg total) by mouth every 6 (six) hours as needed.   cetirizine (ZYRTEC) 10 MG tablet Take 1 tablet (10 mg total) by mouth daily.   fluorometholone (FML) 0.1 % ophthalmic suspension Place 1 drop into both eyes 2 times daily.   lisinopril-hydrochlorothiazide (ZESTORETIC) 10-12.5 MG tablet Take 1 tablet by mouth daily.   mometasone (NASONEX) 50 MCG/ACT nasal spray 2 sprays each nostril daily.   Multiple Vitamin (MULTI-VITAMIN) tablet Take by mouth daily.   Systane eyedrops as well--uses as needed per patient  Allergies  Allergen Reactions   Naproxen Swelling    Eyes swelled with Naproxen    Past Medical History:  Diagnosis Date   Hypertension 2019   Past Surgical History:  Procedure Laterality Date   ABDOMINAL HYSTERECTOMY   1992   For fibroid tumor; also left Oophorectomy.     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   Stroke Brother 69   Hypertension Brother    Other Brother        BPH   Other Brother     Social History   Socioeconomic History   Marital status: Widowed    Spouse name: Not on file   Number of children: 3   Years of education: Not on file   Highest education level: 6th grade  Occupational History   Occupation: business cleaning  Tobacco Use   Smoking status: Former Smoker    Types: Cigarettes   Smokeless tobacco: Never Used  Scientific laboratory technician Use: Never used  Substance and Sexual Activity   Alcohol use: Not Currently    Comment: History of overuse many years  ago when husband left her.  Rare now.     Drug use: Never   Sexual activity: Not Currently  Other Topics Concern   Not on file  Social History Narrative   Husband left her though never formally divorced.     He died in early 18s   Lives with her daughter and her husband and 3 grandchildren.   Social Determinants of Health   Financial Resource Strain: Low Risk    Difficulty of Paying Living Expenses: Not very hard  Food Insecurity: No Food Insecurity   Worried About Charity fundraiser in the Last Year: Never true   Ran Out of Food in the Last Year: Never true  Transportation Needs: No Transportation Needs   Lack of Transportation (Medical): No   Lack of Transportation (Non-Medical): No  Physical Activity: Not on file  Stress: Not on file  Social Connections: Not on file  Intimate Partner Violence: Not At Risk   Fear of Current or Ex-Partner: No   Emotionally Abused: No   Physically  Abused: No   Sexually Abused: No     Review of Systems  HENT: Positive for dental problem (Has terrible dental decay, but refuses to have them removed to have dentures.  She is going in for teeth cleaning and general exam.).   Eyes: Negative for visual disturbance (Follows with Dr. Susa Simmonds.  Has dry eyes.  Uses reading glasses.  ).  Respiratory: Negative for shortness of breath.   Cardiovascular: Negative for chest pain and leg swelling.  Musculoskeletal:       Having pain in right inner thigh when on her feet.  Has been noting for 1 month.   Not clear if acute injury caused or came on suddenly. Can hear a pop at times when turns, but not clear if has pain then.  Painful when she stretches her leg.    Has a little "ball"  On her right lateral thigh that hurts.  Pulsing pain.        Objective:   BP 100/68 (BP Location: Left Arm, Patient Position: Sitting, Cuff Size: Normal)   Pulse 89   Resp 12   Ht 4\' 9"  (1.448 m)   Wt 125 lb 8 oz (56.9 kg)   BMI 27.16 kg/m   Physical Exam HENT:     Head: Normocephalic and atraumatic.     Right Ear: Tympanic membrane, ear canal and external ear normal.     Left Ear: Tympanic membrane, ear canal and external ear normal.     Nose: Nose normal.     Mouth/Throat:     Mouth: Mucous membranes are moist.     Pharynx: Oropharynx is clear.     Comments: Significant and diffuse dental decay with multiple missing teeth. Eyes:     Extraocular Movements: Extraocular movements intact.     Conjunctiva/sclera: Conjunctivae normal.     Pupils: Pupils are equal, round, and reactive to light.     Comments: Bilateral pterygium not affecting visual fields.  Discs sharp.  Neck:     Thyroid: No thyroid mass or thyromegaly.  Cardiovascular:     Rate and Rhythm: Normal rate and regular rhythm.     Heart sounds: S1 normal and S2 normal. No murmur heard.   No friction rub. No S3 or S4 sounds.     Comments: No carotid bruits.  Carotid, radial, femoral, DP  and PT pulses normal and equal.    Pulmonary:     Effort: Pulmonary effort  is normal.     Breath sounds: Normal breath sounds.  Chest:  Breasts:    Right: No inverted nipple, mass, nipple discharge or skin change.     Left: No inverted nipple, mass, nipple discharge or skin change.  Abdominal:     General: Bowel sounds are normal.     Palpations: Abdomen is soft. There is no hepatomegaly, splenomegaly or mass.     Tenderness: There is no abdominal tenderness.     Hernia: No hernia is present.  Musculoskeletal:        General: Normal range of motion.     Cervical back: Normal range of motion and neck supple.  Lymphadenopathy:     Head:     Right side of head: No submental or submandibular adenopathy.     Left side of head: No submental or submandibular adenopathy.     Cervical: No cervical adenopathy.     Upper Body:     Right upper body: No supraclavicular or axillary adenopathy.     Left upper body: No supraclavicular or axillary adenopathy.     Lower Body: No right inguinal adenopathy. No left inguinal adenopathy.  Skin:    General: Skin is warm.     Capillary Refill: Capillary refill takes less than 2 seconds.     Findings: No rash.  Neurological:     General: No focal deficit present.     Mental Status: She is alert and oriented to person, place, and time.     Cranial Nerves: Cranial nerves 2-12 are intact.     Sensory: Sensation is intact.     Motor: Motor function is intact.     Coordination: Coordination is intact.     Gait: Gait is intact.     Deep Tendon Reflexes: Reflexes are normal and symmetric.  Psychiatric:        Attention and Perception: Attention normal.        Mood and Affect: Mood normal.        Speech: Speech normal.        Behavior: Behavior normal. Behavior is cooperative.     Assessment & Plan   CPE without pap Mammogram planned for 11/2021 Guaiac Cards x 3 to return in 2 weeks CBC, CMP, FLP Pfizer booster only today.   Encouraged obtaining  shingles vaccine at [facility].  2.  Hypertension:  controlled.  Followup in 6 months.  3.  Right thigh tendinitis:  stretches.  4.  Hypercholesterolemia:  FLP  2.

## 2021-01-19 NOTE — Telephone Encounter (Signed)
Patient was notified and she confirmed that was able to picked it up.

## 2021-01-20 LAB — COMPREHENSIVE METABOLIC PANEL
ALT: 23 IU/L (ref 0–32)
AST: 36 IU/L (ref 0–40)
Albumin/Globulin Ratio: 2 (ref 1.2–2.2)
Albumin: 4.8 g/dL (ref 3.8–4.8)
Alkaline Phosphatase: 75 IU/L (ref 44–121)
BUN/Creatinine Ratio: 29 — ABNORMAL HIGH (ref 12–28)
BUN: 18 mg/dL (ref 8–27)
Bilirubin Total: 0.5 mg/dL (ref 0.0–1.2)
CO2: 26 mmol/L (ref 20–29)
Calcium: 10.2 mg/dL (ref 8.7–10.3)
Chloride: 103 mmol/L (ref 96–106)
Creatinine, Ser: 0.63 mg/dL (ref 0.57–1.00)
Globulin, Total: 2.4 g/dL (ref 1.5–4.5)
Glucose: 92 mg/dL (ref 65–99)
Potassium: 5.7 mmol/L — ABNORMAL HIGH (ref 3.5–5.2)
Sodium: 140 mmol/L (ref 134–144)
Total Protein: 7.2 g/dL (ref 6.0–8.5)
eGFR: 95 mL/min/{1.73_m2} (ref >59)

## 2021-01-20 LAB — CBC WITH DIFFERENTIAL/PLATELET
Basophils Absolute: 0.1 10*3/uL (ref 0.0–0.2)
Basos: 1 %
EOS (ABSOLUTE): 0.1 10*3/uL (ref 0.0–0.4)
Eos: 2 %
Hematocrit: 37.5 % (ref 34.0–46.6)
Hemoglobin: 12.4 g/dL (ref 11.1–15.9)
Immature Grans (Abs): 0 10*3/uL (ref 0.0–0.1)
Immature Granulocytes: 0 %
Lymphocytes Absolute: 2.3 10*3/uL (ref 0.7–3.1)
Lymphs: 43 %
MCH: 30.8 pg (ref 26.6–33.0)
MCHC: 33.1 g/dL (ref 31.5–35.7)
MCV: 93 fL (ref 79–97)
Monocytes Absolute: 0.6 10*3/uL (ref 0.1–0.9)
Monocytes: 11 %
Neutrophils Absolute: 2.3 10*3/uL (ref 1.4–7.0)
Neutrophils: 43 %
Platelets: 460 10*3/uL — ABNORMAL HIGH (ref 150–450)
RBC: 4.02 x10E6/uL (ref 3.77–5.28)
RDW: 12.2 % (ref 11.7–15.4)
WBC: 5.3 10*3/uL (ref 3.4–10.8)

## 2021-01-20 LAB — LIPID PANEL W/O CHOL/HDL RATIO
Cholesterol, Total: 222 mg/dL — ABNORMAL HIGH (ref 100–199)
HDL: 74 mg/dL (ref >39)
LDL Chol Calc (NIH): 138 mg/dL — ABNORMAL HIGH (ref 0–99)
Triglycerides: 60 mg/dL (ref 0–149)
VLDL Cholesterol Cal: 10 mg/dL (ref 5–40)

## 2021-02-02 ENCOUNTER — Encounter: Payer: Self-pay | Admitting: Internal Medicine

## 2021-02-24 ENCOUNTER — Other Ambulatory Visit (INDEPENDENT_AMBULATORY_CARE_PROVIDER_SITE_OTHER): Payer: Self-pay

## 2021-02-24 DIAGNOSIS — Z1211 Encounter for screening for malignant neoplasm of colon: Secondary | ICD-10-CM

## 2021-02-24 LAB — POC HEMOCCULT BLD/STL (HOME/3-CARD/SCREEN)
Card #2 Fecal Occult Blod, POC: NEGATIVE
Card #3 Fecal Occult Blood, POC: NEGATIVE
Fecal Occult Blood, POC: NEGATIVE

## 2021-03-27 ENCOUNTER — Other Ambulatory Visit: Payer: Self-pay

## 2021-03-27 DIAGNOSIS — E875 Hyperkalemia: Secondary | ICD-10-CM

## 2021-03-28 LAB — BASIC METABOLIC PANEL
BUN/Creatinine Ratio: 31 — ABNORMAL HIGH (ref 12–28)
BUN: 22 mg/dL (ref 8–27)
CO2: 27 mmol/L (ref 20–29)
Calcium: 9.9 mg/dL (ref 8.7–10.3)
Chloride: 103 mmol/L (ref 96–106)
Creatinine, Ser: 0.72 mg/dL (ref 0.57–1.00)
Glucose: 90 mg/dL (ref 65–99)
Potassium: 5.1 mmol/L (ref 3.5–5.2)
Sodium: 142 mmol/L (ref 134–144)
eGFR: 90 mL/min/{1.73_m2} (ref >59)

## 2021-05-01 NOTE — Progress Notes (Signed)
Pt.notified

## 2021-07-24 ENCOUNTER — Encounter: Payer: Self-pay | Admitting: Internal Medicine

## 2021-10-18 ENCOUNTER — Telehealth: Payer: Self-pay

## 2021-10-18 NOTE — Telephone Encounter (Signed)
Patient would like an appointment or recommendation after slipping in the kitchen and possible fracturing her hip about 6 months. Patient fell on her side and heard what she believes was a cracking sound. She struggles to walk fast and has pain if she walks for a long period of time. Patient takes tylenol which helps with pain, but pain usually returns.

## 2021-10-20 ENCOUNTER — Other Ambulatory Visit: Payer: Self-pay

## 2021-10-20 ENCOUNTER — Encounter: Payer: Self-pay | Admitting: Internal Medicine

## 2021-10-20 ENCOUNTER — Ambulatory Visit: Payer: Self-pay | Admitting: Internal Medicine

## 2021-10-20 VITALS — BP 110/80 | HR 76 | Resp 12 | Ht <= 58 in | Wt 127.5 lb

## 2021-10-20 DIAGNOSIS — G8929 Other chronic pain: Secondary | ICD-10-CM | POA: Insufficient documentation

## 2021-10-20 DIAGNOSIS — H547 Unspecified visual loss: Secondary | ICD-10-CM | POA: Insufficient documentation

## 2021-10-20 DIAGNOSIS — M5442 Lumbago with sciatica, left side: Secondary | ICD-10-CM

## 2021-10-20 DIAGNOSIS — Z23 Encounter for immunization: Secondary | ICD-10-CM

## 2021-10-20 DIAGNOSIS — K0889 Other specified disorders of teeth and supporting structures: Secondary | ICD-10-CM

## 2021-10-20 MED ORDER — ACETAMINOPHEN 500 MG PO TABS: ORAL_TABLET | ORAL | 0 refills | Status: DC

## 2021-10-20 NOTE — Progress Notes (Signed)
Subjective:    Patient ID: Becky Holland, female   DOB: 1951-05-28, 71 y.o.   MRN: [id number]   HPI  Becky Holland interprets   Bilateral low back and hip pain:  Has been a problem for about 6 months.  Pain radiates into buttocks and posterior thighs bilaterally.  No weakness or numbness/tingling into legs. Pain is worse if she bends over, but has pain when standing and walking as well.  Does have pain in medial right thigh, but that is something she has had for a long time.   Has had a lot of falls and slips.  In particular slipped and fell caring for grandchildren and landed on right low back and lateral pelvis, though heard a crack or pop on her left low back.  Developed pain on left low back at the time.   Allergic to Naproxen, so has not taken anything for the pain.   2.  Requesting ophthalmologic referral:  She can no longer afford to go to Dr. Susa Simmonds.  She did not receive financial assistance with [facility].  Not clear why as at 100% or less of [employer].  She is currently paying $50 per month with his office.   Discussed we would not be able to get the laser treatments with another provider through the orange card.    3.  Dental:  left posterior molar with pain/cavity.    4.  HM:  has not had COVID bivalent nor Shingrix vaccines.  She is open to getting both today.     Current Meds  Medication Sig   acetaminophen (TYLENOL) 500 MG tablet Take 1 tablet (500 mg total) by mouth every 6 (six) hours as needed.   cetirizine (ZYRTEC) 10 MG tablet Take 1 tablet (10 mg total) by mouth daily.   fluorometholone (FML) 0.1 % ophthalmic suspension Place 1 drop into both eyes 2 times daily.   lisinopril-hydrochlorothiazide (ZESTORETIC) 10-12.5 MG tablet Take 1 tablet by mouth daily.   mometasone (NASONEX) 50 MCG/ACT nasal spray 2 sprays each nostril daily.   Multiple Vitamin (MULTI-VITAMIN) tablet Take by mouth daily.    Probiotic Product (PROBIOTIC-10 PO) Take by mouth daily.  Unsure of doseage   Allergies  Allergen Reactions   Naproxen Swelling    Eyes swelled with Naproxen      Review of Systems    Objective:   BP 110/80 (BP Location: Right Arm, Patient Position: Sitting, Cuff Size: Normal)    Pulse 76    Resp 12    Ht 4\' 9"  (1.448 m)    Wt 127 lb 8 oz (57.8 kg)    BMI 27.59 kg/m   Physical Exam HEENT:  PERRL.  Severe loss of bite surface of most of teeth remaining.  Significant tooth loss.  Large cavity in left lower posterior molar.  Multiple other teeth with cavities as well. Neck:  Supple, No adenopathy Chest:  CTA CV:  RRR without murmur or rub.  Radial pulses normal and equal MS:  NT over spinous processes of L/S spine.  NT over low back musculature Full ROM of back.  Negative straight leg raise bilaterally. Neuro:  LE:  Motor 5/5, DTRs 2+/4.   Sensory to light touch grossly normal.  Gait normal.  Moves to exam table fluidly and without obvious discomfort.     Assessment & Plan    Bilateral low back pain with bilateral radiculopathy:  not clear if this is more in the S2 distribution or L2 or multilevel.  Start with Xray of lumbosacral spine.  Suspect will ultimately need MR.  Tylenol 1000 mg twice daily.  Referral to PT in [facility].  No findings on exam today.  2.  Eye concerns:  appears she needs further follow up from an eye surgical standpoint, which we would not be able to obtain through orange card locally.  Eli will call her daughter to see if can get any clarification on whether they applied for financial assistance and if turned down, why.  Also to find out why sent to collections, which patient admits during exam.  Not clear they have been able to follow through with payment plan as she described in history.  If not clarified, will call [facility] directly.  By her history, she was to follow up end of March.  3.  Dental decay:  diffuse, though large cavity in left lower molar.  Dental clinic referral.    4. HM:  Shingrix and Moderna  bivalent vaccines today

## 2021-10-23 NOTE — Telephone Encounter (Signed)
Patient has been seen by Dr Amil Amen

## 2021-12-18 ENCOUNTER — Other Ambulatory Visit: Payer: Self-pay

## 2021-12-18 MED ORDER — LISINOPRIL-HYDROCHLOROTHIAZIDE 10-12.5 MG PO TABS: 1.0000 | ORAL_TABLET | Freq: Every day | ORAL | 9 refills | Status: DC

## 2021-12-25 ENCOUNTER — Other Ambulatory Visit: Payer: Self-pay

## 2021-12-25 DIAGNOSIS — Z1231 Encounter for screening mammogram for malignant neoplasm of breast: Secondary | ICD-10-CM

## 2022-01-16 ENCOUNTER — Inpatient Hospital Stay: Admission: RE | Admit: 2022-01-16 | Payer: No Typology Code available for payment source | Source: Ambulatory Visit

## 2022-01-17 ENCOUNTER — Ambulatory Visit: Payer: Self-pay | Admitting: Internal Medicine

## 2022-01-19 ENCOUNTER — Encounter: Payer: Self-pay | Admitting: Internal Medicine

## 2022-02-25 ENCOUNTER — Ambulatory Visit (HOSPITAL_COMMUNITY)
Admission: EM | Admit: 2022-02-25 | Discharge: 2022-02-25 | Disposition: A | Discharge status: Home / Self Care | Payer: No Typology Code available for payment source | Attending: Physician Assistant | Admitting: Physician Assistant

## 2022-02-25 ENCOUNTER — Encounter (HOSPITAL_COMMUNITY): Payer: Self-pay | Admitting: Emergency Medicine

## 2022-02-25 DIAGNOSIS — K2900 Acute gastritis without bleeding: Secondary | ICD-10-CM

## 2022-02-25 DIAGNOSIS — R110 Nausea: Secondary | ICD-10-CM

## 2022-02-25 MED ORDER — ONDANSETRON 4 MG PO TBDP
4.0000 mg | ORAL_TABLET | Freq: Once | ORAL | Status: AC
  Administered 2022-02-25: 4 mg via ORAL

## 2022-02-25 MED ORDER — ONDANSETRON 4 MG PO TBDP
ORAL_TABLET | ORAL | Status: AC
  Filled 2022-02-25: qty 1

## 2022-02-25 MED ORDER — FAMOTIDINE 20 MG PO TABS: 20.0000 mg | ORAL_TABLET | Freq: Two times a day (BID) | ORAL | 0 refills | Status: DC

## 2022-02-25 MED ORDER — ALUM & MAG HYDROXIDE-SIMETH 200-200-20 MG/5ML PO SUSP
30.0000 mL | Freq: Once | ORAL | Status: AC
  Administered 2022-02-25: 30 mL via ORAL

## 2022-02-25 MED ORDER — ALUM & MAG HYDROXIDE-SIMETH 200-200-20 MG/5ML PO SUSP
ORAL | Status: AC
  Filled 2022-02-25: qty 30

## 2022-02-25 MED ORDER — ONDANSETRON HCL 4 MG PO TABS: 4.0000 mg | ORAL_TABLET | Freq: Three times a day (TID) | ORAL | 0 refills | Status: DC | PRN

## 2022-03-05 ENCOUNTER — Encounter: Payer: Self-pay | Admitting: Internal Medicine

## 2022-03-05 ENCOUNTER — Ambulatory Visit: Payer: Self-pay | Admitting: Internal Medicine

## 2022-03-05 VITALS — BP 136/78 | HR 80 | Resp 12 | Ht <= 58 in | Wt 129.0 lb

## 2022-03-05 DIAGNOSIS — Z23 Encounter for immunization: Secondary | ICD-10-CM

## 2022-03-05 DIAGNOSIS — M47817 Spondylosis without myelopathy or radiculopathy, lumbosacral region: Secondary | ICD-10-CM

## 2022-03-05 DIAGNOSIS — Z0000 Encounter for general adult medical examination without abnormal findings: Secondary | ICD-10-CM

## 2022-03-05 DIAGNOSIS — E7800 Pure hypercholesterolemia, unspecified: Secondary | ICD-10-CM

## 2022-03-05 DIAGNOSIS — I8393 Asymptomatic varicose veins of bilateral lower extremities: Secondary | ICD-10-CM

## 2022-03-05 DIAGNOSIS — Z1231 Encounter for screening mammogram for malignant neoplasm of breast: Secondary | ICD-10-CM

## 2022-03-05 DIAGNOSIS — I10 Essential (primary) hypertension: Secondary | ICD-10-CM

## 2022-03-05 DIAGNOSIS — K2900 Acute gastritis without bleeding: Secondary | ICD-10-CM

## 2022-03-05 DIAGNOSIS — G8929 Other chronic pain: Secondary | ICD-10-CM

## 2022-03-05 NOTE — Progress Notes (Signed)
Subjective:    Patient ID: Becky Holland, female   DOB: 05/10/1951, 71 y.o.   MRN: [id number]   HPI  CPE without pap  1.  Pap:  History of TAH, LSO 30 years ago for benign reasons.  Has had paps since, and all normal.     2.  Mammogram:  Mammogram compression studies of left breast on 12/2020 ultimately showed no abnormality where screening study showed concern for asymmetry.  She has not had her follow up screening mammogram this year.  3.  Osteoprevention:  DEXA normal in 2021.  Milk gives her diarrhea. So minimal intake.  States she is taking calcium/Vitamin D supplement.  Walks all afternoon long with work.    4.  Guaiac Cards/FIT:  Negative for blood 02/24/21.   5.  Colonoscopy:  Never.  No family history of colon cancer.    6.  Immunizations:  Needs 2nd Shingrix Immunization History  Administered Date(s) Administered   Influenza-Unspecified 06/18/2019, 04/25/2020   Moderna Covid-19 Vaccine Bivalent Booster 97yr & up 10/20/2021   PFIZER Comirnaty(Gray Top)Covid-19 Tri-Sucrose Vaccine 01/19/2021   PFIZER(Purple Top)SARS-COV-2 Vaccination 10/24/2019, 11/17/2019, 06/27/2020   Pneumococcal Conjugate-13 07/23/2019   Pneumococcal Polysaccharide-23 08/04/2020   Tdap 01/21/2020   Zoster Recombinat (Shingrix) 10/20/2021     7.  Glucose/Cholesterol :  Glucose fine in past.  Cholesterol slightly high in past  8.  Other:  low back pain:  Xray on 02/28/2022 shows signficant DJD, retrolisthesis L3 on L4.  Discs spaces narrowed throughout save for L5-S1.  No radicular pain, but does have right groin pain.  She has [facility].  She feels this has helped with pain.  Current Meds  Medication Sig   cetirizine (ZYRTEC) 10 MG tablet Take 1 tablet (10 mg total) by mouth daily.   fluorometholone (FML) 0.1 % ophthalmic suspension Place 1 drop into both eyes 2 times daily.   lisinopril-hydrochlorothiazide (ZESTORETIC) 10-12.5 MG tablet Take 1 tablet by mouth  daily.   mometasone (NASONEX) 50 MCG/ACT nasal spray 2 sprays each nostril daily.   Multiple Vitamin (MULTI-VITAMIN) tablet Take by mouth daily.    Probiotic Product (PROBIOTIC-10 PO) Take by mouth daily. Unsure of doseage   Allergies  Allergen Reactions   Naproxen Swelling    Eyes swelled with Naproxen    Past Medical History:  Diagnosis Date   Hypertension 2019   Past Surgical History:  Procedure Laterality Date   ABDOMINAL HYSTERECTOMY  1992   For fibroid tumor; also left Oophorectomy.     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   Stroke Brother 69   Hypertension Brother    Other Brother        BPH   Other Brother    Social History   Socioeconomic History   Marital status: Widowed    Spouse name: Not on file   Number of children: 3   Years of education: Not on file   Highest education level: 6th grade  Occupational History   Occupation: business cleaning  Tobacco Use  Smoking status: Former    Types: Cigarettes    Passive exposure: Never   Smokeless tobacco: Never  Vaping Use   Vaping Use: Never used  Substance and Sexual Activity   Alcohol use: Not Currently    Comment: History of overuse many years ago when husband left her.  Rare now.     Drug use: Never   Sexual activity: Not Currently  Other Topics Concern   Not on file  Social History Narrative   Husband left her though never formally divorced.     He died in early 70s   Lives with her daughter and her husband and 3 grandchildren.   Social Determinants of Health   Financial Resource Strain: Low Risk  (03/05/2022)   Overall Financial Resource Strain (CARDIA)     Difficulty of Paying Living Expenses: Not hard at all  Food Insecurity: No Food Insecurity (05/08/2022)   Hunger Vital Sign    Worried About Running Out of Food in the Last Year: Never true    Ran Out of Food in the Last Year: Never true  Transportation Needs: No Transportation Needs (05/08/2022)   PRAPARE - Hydrologist (Medical): No    Lack of Transportation (Non-Medical): No  Physical Activity: Not on file  Stress: Not on file  Social Connections: Not on file  Intimate Partner Violence: Not At Risk (03/05/2022)   Humiliation, Afraid, Rape, and Kick questionnaire    Fear of Current or Ex-Partner: No    Emotionally Abused: No    Physically Abused: No    Sexually Abused: No     Review of Systems  Respiratory:  Negative for shortness of breath.   Cardiovascular:  Negative for chest pain, palpitations and leg swelling.  Gastrointestinal:  Positive for abdominal pain (Epigatric pain starting 02/25/22 which took her to ED same day.  Also with nausea.  Was prescribed Famotidine and Zofran  No longer nauseated, but taking Zofran reguarly,) and blood in stool (At times and also describes black stickty stool recently as well.).  Neurological:  Positive for dizziness (1.5 weeks ago, in line for food and felt she was spinning and would fall.  Lasted about 1 minute, went away and has not recurred,).      Objective:   BP 136/78 (BP Location: Left Arm, Patient Position: Sitting, Cuff Size: Normal)   Pulse 80   Resp 12   Ht 4' 8.5" (1.435 m)   Wt 129 lb (58.5 kg)   BMI 28.41 kg/m   Physical Exam HENT:     Head: Normocephalic and atraumatic.     Right Ear: Tympanic membrane, ear canal and external ear normal.     Left Ear: Tympanic membrane, ear canal and external ear normal.     Nose: Nose normal.     Mouth/Throat:     Mouth: Mucous membranes are moist.     Pharynx: Oropharynx is clear.     Comments: Dental decay Eyes:     Extraocular Movements: Extraocular  movements intact.     Conjunctiva/sclera: Conjunctivae normal.     Pupils:     Right eye: Pupil is not round (oblong with what appear to be post surgical changes.). Pupil is reactive.     Left eye: Pupil is reactive.  Neck:     Thyroid: No thyroid mass or thyromegaly.  Cardiovascular:     Rate and Rhythm: Normal rate and regular rhythm.     Heart sounds: S1 normal and S2 normal.  No murmur heard.    No friction rub. No S3 or S4 sounds.     Comments: No carotid bruits.  Carotid, radial, femoral, DP and PT pulses normal and equal.  Spider veins and varicosities of bilateral lower extremities.   Pulmonary:     Effort: Pulmonary effort is normal.     Breath sounds: Normal breath sounds and air entry.  Chest:  Breasts:    Right: No inverted nipple, mass, nipple discharge or skin change.     Left: No inverted nipple, mass, nipple discharge or skin change.  Abdominal:     General: Bowel sounds are normal.     Palpations: Abdomen is soft. There is no hepatomegaly, splenomegaly or mass.     Tenderness: There is no abdominal tenderness.     Hernia: No hernia is present.  Genitourinary:    Comments: Normal external female genitalia.   No adnexal mass or tenderness. Musculoskeletal:        General: Normal range of motion.     Cervical back: Normal range of motion and neck supple.     Right knee: Deformity (hypertrophic changes with varus deformity) present.     Left knee: Deformity (hypertrophic changes with varus deformity.) present.     Right lower leg: No edema.     Left lower leg: No edema.  Lymphadenopathy:     Head:     Right side of head: No submental or submandibular adenopathy.     Left side of head: No submental or submandibular adenopathy.     Cervical: No cervical adenopathy.     Upper Body:     Right upper body: No supraclavicular or axillary adenopathy.     Left upper body: No supraclavicular or axillary adenopathy.     Lower Body: No right inguinal adenopathy. No left  inguinal adenopathy.  Skin:    General: Skin is warm.     Capillary Refill: Capillary refill takes less than 2 seconds.     Findings: No rash.  Neurological:     General: No focal deficit present.     Mental Status: She is alert and oriented to person, place, and time.     Cranial Nerves: Cranial nerves 2-12 are intact.     Sensory: Sensation is intact.     Motor: Motor function is intact.     Coordination: Coordination is intact.     Gait: Gait is intact.     Deep Tendon Reflexes: Reflexes are normal and symmetric.  Psychiatric:        Mood and Affect: Mood is anxious.        Behavior: Behavior normal. Behavior is cooperative.      Assessment & Plan    CPE without pap Mammogram Shingrix #2/2 Discussed likely COVID vaccine in fall.  Influenza vaccine in fall.  2.  Rectal Bleeding/possible melena with recent gastritis:  CBC with fasting labs in next 2 weeks.  FIT to return in 2 weeks.  History of anal fissures and hemorrhoids.  Rectal bleeding chronically intermittent.  Have not been able to get her in  for diagnostic colonoscopy.  3.  Hypercholesterolemia:  FLP with upcoming labs.    4.  Hypertension:  controlled.  CMP with labs.    5.  DJD/DDD of L/S spine:  continue with PT in [place].

## 2022-03-16 ENCOUNTER — Telehealth: Payer: Self-pay

## 2022-03-16 NOTE — Telephone Encounter (Signed)
Patient called after being contacted for a follow up mammogram. Patient was told that PCP need to make a referral for her yearly mammogram before being scheduled

## 2022-03-20 ENCOUNTER — Other Ambulatory Visit (INDEPENDENT_AMBULATORY_CARE_PROVIDER_SITE_OTHER): Payer: Self-pay | Admitting: Internal Medicine

## 2022-03-20 DIAGNOSIS — Z1211 Encounter for screening for malignant neoplasm of colon: Secondary | ICD-10-CM

## 2022-03-20 LAB — POC FIT TEST STOOL: Fecal Occult Blood: NEGATIVE

## 2022-03-22 ENCOUNTER — Other Ambulatory Visit: Payer: Self-pay

## 2022-03-22 DIAGNOSIS — Z79899 Other long term (current) drug therapy: Secondary | ICD-10-CM

## 2022-03-22 DIAGNOSIS — E7800 Pure hypercholesterolemia, unspecified: Secondary | ICD-10-CM

## 2022-03-23 LAB — COMPREHENSIVE METABOLIC PANEL
ALT: 20 IU/L (ref 0–32)
AST: 22 IU/L (ref 0–40)
Albumin/Globulin Ratio: 1.8 (ref 1.2–2.2)
Albumin: 4.3 g/dL (ref 3.8–4.8)
Alkaline Phosphatase: 83 IU/L (ref 44–121)
BUN/Creatinine Ratio: 30 — ABNORMAL HIGH (ref 12–28)
BUN: 19 mg/dL (ref 8–27)
Bilirubin Total: 0.5 mg/dL (ref 0.0–1.2)
CO2: 25 mmol/L (ref 20–29)
Calcium: 10.3 mg/dL (ref 8.7–10.3)
Chloride: 98 mmol/L (ref 96–106)
Creatinine, Ser: 0.64 mg/dL (ref 0.57–1.00)
Globulin, Total: 2.4 g/dL (ref 1.5–4.5)
Glucose: 89 mg/dL (ref 70–99)
Potassium: 4.6 mmol/L (ref 3.5–5.2)
Sodium: 135 mmol/L (ref 134–144)
Total Protein: 6.7 g/dL (ref 6.0–8.5)
eGFR: 94 mL/min/{1.73_m2} (ref >59)

## 2022-03-23 LAB — CBC WITH DIFFERENTIAL/PLATELET
Basophils Absolute: 0.1 10*3/uL (ref 0.0–0.2)
Basos: 1 %
EOS (ABSOLUTE): 0.2 10*3/uL (ref 0.0–0.4)
Eos: 3 %
Hematocrit: 35.7 % (ref 34.0–46.6)
Hemoglobin: 12.3 g/dL (ref 11.1–15.9)
Immature Grans (Abs): 0 10*3/uL (ref 0.0–0.1)
Immature Granulocytes: 0 %
Lymphocytes Absolute: 2.7 10*3/uL (ref 0.7–3.1)
Lymphs: 44 %
MCH: 31.9 pg (ref 26.6–33.0)
MCHC: 34.5 g/dL (ref 31.5–35.7)
MCV: 93 fL (ref 79–97)
Monocytes Absolute: 0.7 10*3/uL (ref 0.1–0.9)
Monocytes: 11 %
Neutrophils Absolute: 2.5 10*3/uL (ref 1.4–7.0)
Neutrophils: 41 %
Platelets: 492 10*3/uL — ABNORMAL HIGH (ref 150–450)
RBC: 3.85 x10E6/uL (ref 3.77–5.28)
RDW: 12.5 % (ref 11.7–15.4)
WBC: 6.1 10*3/uL (ref 3.4–10.8)

## 2022-03-23 LAB — LIPID PANEL W/O CHOL/HDL RATIO
Cholesterol, Total: 215 mg/dL — ABNORMAL HIGH (ref 100–199)
HDL: 67 mg/dL (ref >39)
LDL Chol Calc (NIH): 132 mg/dL — ABNORMAL HIGH (ref 0–99)
Triglycerides: 92 mg/dL (ref 0–149)
VLDL Cholesterol Cal: 16 mg/dL (ref 5–40)

## 2022-04-05 ENCOUNTER — Inpatient Hospital Stay: Admission: RE | Admit: 2022-04-05 | Payer: Self-pay | Source: Ambulatory Visit

## 2022-04-09 ENCOUNTER — Encounter (HOSPITAL_COMMUNITY): Payer: Self-pay | Admitting: Emergency Medicine

## 2022-04-09 ENCOUNTER — Emergency Department (HOSPITAL_COMMUNITY)
Admission: EM | Admit: 2022-04-09 | Discharge: 2022-04-09 | Disposition: A | Discharge status: Home / Self Care | Payer: No Typology Code available for payment source | Attending: Emergency Medicine | Admitting: Emergency Medicine

## 2022-04-09 DIAGNOSIS — T6594XA Toxic effect of unspecified substance, undetermined, initial encounter: Secondary | ICD-10-CM | POA: Insufficient documentation

## 2022-04-09 DIAGNOSIS — H10211 Acute toxic conjunctivitis, right eye: Secondary | ICD-10-CM | POA: Insufficient documentation

## 2022-04-09 MED ORDER — TETRACAINE HCL 0.5 % OP SOLN
2.0000 [drp] | Freq: Once | OPHTHALMIC | Status: DC
  Filled 2022-04-09: qty 4

## 2022-04-09 MED ORDER — ERYTHROMYCIN 5 MG/GM OP OINT
TOPICAL_OINTMENT | Freq: Once | OPHTHALMIC | Status: AC
  Filled 2022-04-09: qty 3.5

## 2022-04-09 NOTE — ED Provider Notes (Signed)
[facility] EMERGENCY DEPARTMENT Provider Note   CSN: [id number] Arrival date & time: 04/09/22  1544     History  Chief Complaint  Patient presents with   Eye Pain    Becky Holland is a 71 y.o. female.  Patient presents with right eye irritation and injection since accidentally giving bleach solution to right eye on Friday.  Patient's tried flushing multiple times and topical antihistamine drops without improvement.  Mild grittiness.  Patient has history of cataract surgery in the right eye 3 years ago.  Patient has an eye doctor but they could not get her in.  Patient denies fevers, headache or vomiting.       Home Medications Prior to Admission medications   Medication Sig Start Date End Date Taking? Authorizing Provider  acetaminophen (TYLENOL) 500 MG tablet 2 pastillas dos por la boca dos veces al dia. Patient not taking: Reported on 03/05/2022 10/20/21   Mack Hook, MD  cetirizine (ZYRTEC) 10 MG tablet Take 1 tablet (10 mg total) by mouth daily. 01/21/20   Mack Hook, MD  famotidine (PEPCID) 20 MG tablet Take 1 tablet (20 mg total) by mouth 2 (two) times daily. Patient not taking: Reported on 03/05/2022 02/25/22   Nyoka Lint, PA-C  fluorometholone (FML) 0.1 % ophthalmic suspension Place 1 drop into both eyes 2 times daily.    [provider]  lisinopril-hydrochlorothiazide (ZESTORETIC) 10-12.5 MG tablet Take 1 tablet by mouth daily. 12/18/21   Mack Hook, MD  mometasone (NASONEX) 50 MCG/ACT nasal spray 2 sprays each nostril daily. 07/23/19   Mack Hook, MD  Multiple Vitamin (MULTI-VITAMIN) tablet Take by mouth daily.     [provider]  ondansetron (ZOFRAN) 4 MG tablet Take 1 tablet (4 mg total) by mouth every 8 (eight) hours as needed for nausea or vomiting. Patient not taking: Reported on 03/05/2022 02/25/22   Nyoka Lint, PA-C  Polyethyl Glycol-Propyl Glycol (SYSTANE) 0.4-0.3 % SOLN Apply to eye.  Uses as needed. Patient not taking: Reported on 10/20/2021    [provider]  Probiotic Product (PROBIOTIC-10 PO) Take by mouth daily. Unsure of doseage    [provider]      Allergies    Naproxen    Review of Systems   Review of Systems  Constitutional:  Negative for chills and fever.  HENT:  Negative for congestion.   Eyes:  Positive for redness. Negative for visual disturbance.  Respiratory:  Negative for shortness of breath.   Cardiovascular:  Negative for chest pain.  Gastrointestinal:  Negative for abdominal pain and vomiting.  Genitourinary:  Negative for dysuria and flank pain.  Musculoskeletal:  Negative for back pain, neck pain and neck stiffness.  Skin:  Negative for rash.  Neurological:  Negative for light-headedness and headaches.    Physical Exam Updated Vital Signs BP 122/87 (BP Location: Left Arm)   Pulse 77   Temp 98.8 F (37.1 C) (Oral)   Resp 18   SpO2 99%  Physical Exam Vitals and nursing note reviewed.  Constitutional:      General: She is not in acute distress.    Appearance: She is well-developed.  HENT:     Head: Normocephalic and atraumatic.     Mouth/Throat:     Mouth: Mucous membranes are moist.  Eyes:     General:        Right eye: No discharge.        Left eye: No discharge.     Comments: Patient has mild  conjunctival injection right sclera, no purulent drainage, extraocular muscle function intact without pain.  No periorbital edema.  No foreign body visualized.  Neck:     Trachea: No tracheal deviation.  Cardiovascular:     Rate and Rhythm: Normal rate.  Pulmonary:     Effort: Pulmonary effort is normal.  Abdominal:     General: There is no distension.  Musculoskeletal:     Cervical back: Normal range of motion.  Skin:    General: Skin is warm.     Capillary Refill: Capillary refill takes less than 2 seconds.     Findings: No rash.  Neurological:     General: No focal deficit present.     Mental Status: She  is alert.     Cranial Nerves: No cranial nerve deficit.  Psychiatric:        Mood and Affect: Mood normal.     ED Results / Procedures / Treatments   Labs (all labs ordered are listed, but only abnormal results are displayed) Labs Reviewed - No data to display  EKG None  Radiology No results found.  Procedures Procedures    Medications Ordered in ED Medications  tetracaine (PONTOCAINE) 0.5 % ophthalmic solution 2 drop (2 drops Right Eye Not Given 04/09/22 2256)  erythromycin ophthalmic ointment ( Right Eye Given 04/09/22 2304)    ED Course/ Medical Decision Making/ A&P                           Medical Decision Making Risk Prescription drug management.   Patient presents with conjunctival injection in the right eye since chemical exposure.  Tested pressure and it was 14.  No signs of significant infection.  Plan for erythromycin topical ointment and patient has follow-up with her eye doctor later this week.  Patient stable for discharge.        Final Clinical Impression(s) / ED Diagnoses Final diagnoses:  Chemical conjunctivitis of right eye    Rx / DC Orders ED Discharge Orders     None         Elnora Morrison, MD 04/09/22 2311

## 2022-04-09 NOTE — ED Triage Notes (Signed)
Patient complains of right eye pain and redness after splashing some diluted bleach solution in her eye on Friday. Patient reports irrigating the eye after the incident.

## 2022-04-09 NOTE — ED Provider Triage Note (Signed)
Emergency Medicine Provider Triage Evaluation Note  Becky Holland , a 71 y.o. female  was evaluated in triage.  Pt complains of right eye redness and "grittiness".  Patient was cleaning on Friday when she accidentally got some bleach solution in her right eye.  She flushed at that time.  She denies any vision changes, pain.  Endorses redness and a feeling of "grittiness".  Patient did have cataract surgery on the right eye approximately 3 years ago.  Review of Systems  Positive: As above Negative: As above  Physical Exam  BP (!) 152/69 (BP Location: Right Arm)   Pulse 94   Temp 99.1 F (37.3 C) (Oral)   Resp 16   SpO2 100%  Gen:   Awake, no distress   Resp:  Normal effort  MSK:   Moves extremities without difficulty  Other:    Medical Decision Making  Medically screening exam initiated at 4:55 PM.  Appropriate orders placed.  Janny Bello-Castillo was informed that the remainder of the evaluation will be completed by another provider, this initial triage assessment does not replace that evaluation, and the importance of remaining in the ED until their evaluation is complete.     Dorothyann Peng, PA-C 04/09/22 1656

## 2022-04-09 NOTE — Discharge Instructions (Signed)
Use topical erythromycin ointment until you see the eye doctor or up to 5 days twice a day. Return for loss of vision, fevers, severe pain in the eye or new concerns. The pressure in your right eye today was 14.

## 2022-04-09 NOTE — ED Notes (Signed)
ED Provider at bedside. 

## 2022-04-10 NOTE — Telephone Encounter (Signed)
Spoke with patient and reminded them of appt details for 04/19/22 at 9:20am at [facility] center

## 2022-04-19 ENCOUNTER — Ambulatory Visit
Admission: RE | Admit: 2022-04-19 | Discharge: 2022-04-19 | Disposition: A | Discharge status: Home / Self Care | Payer: Self-pay | Source: Ambulatory Visit | Attending: Internal Medicine | Admitting: Internal Medicine

## 2022-04-19 DIAGNOSIS — Z1231 Encounter for screening mammogram for malignant neoplasm of breast: Secondary | ICD-10-CM

## 2022-04-20 ENCOUNTER — Other Ambulatory Visit: Payer: Self-pay | Admitting: Internal Medicine

## 2022-04-20 DIAGNOSIS — N644 Mastodynia: Secondary | ICD-10-CM

## 2022-04-20 DIAGNOSIS — N6310 Unspecified lump in the right breast, unspecified quadrant: Secondary | ICD-10-CM

## 2022-05-08 ENCOUNTER — Ambulatory Visit
Admission: RE | Admit: 2022-05-08 | Discharge: 2022-05-08 | Disposition: A | Discharge status: Home / Self Care | Payer: No Typology Code available for payment source | Source: Ambulatory Visit | Attending: Internal Medicine | Admitting: Internal Medicine

## 2022-05-08 ENCOUNTER — Other Ambulatory Visit: Payer: Self-pay | Admitting: Internal Medicine

## 2022-05-08 ENCOUNTER — Ambulatory Visit: Payer: Self-pay | Admitting: Hematology and Oncology

## 2022-05-08 DIAGNOSIS — N6320 Unspecified lump in the left breast, unspecified quadrant: Secondary | ICD-10-CM

## 2022-05-08 DIAGNOSIS — N6310 Unspecified lump in the right breast, unspecified quadrant: Secondary | ICD-10-CM

## 2022-05-08 DIAGNOSIS — N644 Mastodynia: Secondary | ICD-10-CM

## 2022-05-08 NOTE — Progress Notes (Signed)
Ms. Jenavi Beedle is a 71 y.o. female who presents to [facility] clinic today with complaint of bilateral breast pain.    Pap Smear: Pap not smear completed today. Last Pap smear was several years ago. She had hysterectomy in 1992 for benign reasons.  Per patient has history of an abnormal Pap smear with precancerous cells, benign biopsy. Last Pap smear result is not available in Epic.   Physical exam: Breasts Breasts symmetrical. No skin abnormalities bilateral breasts. No nipple retraction bilateral breasts. No nipple discharge bilateral breasts. No lymphadenopathy. No lumps palpated bilateral breasts.       Pelvic/Bimanual Pap is not indicated today    Smoking History: Patient has is a former smoker from 40 years ago and was not referred to quit line.    Patient Navigation: Patient education provided. Access to services provided for patient through [place] interpreter provided. No transportation provided   Colorectal Cancer Screening: Per patient has never had colonoscopy completed No complaints today. FIT test negative from 03/20/2022 at [facility].   Breast and Cervical Cancer Risk Assessment: Patient does not have family history of breast cancer, known genetic mutations, or radiation treatment to the chest before age 15. Patient does not have history of cervical dysplasia, immunocompromised, or DES exposure in-utero.  Risk Assessment   No risk assessment data for the current encounter  Risk Scores       12/29/2020   Last edited by: Royston Bake, CMA   5-year risk: 1.2 %   Lifetime risk: 3.6 %            A: [facility] exam without pap smear Complaint of bilateral breast pain, benign exam.   P: Referred patient to the [facility] for a diagnostic mammogram. Appointment scheduled 05/08/2022.  Melodye Ped, NP 05/08/2022 1:43 PM

## 2022-05-17 ENCOUNTER — Telehealth: Payer: Self-pay

## 2022-05-17 NOTE — Telephone Encounter (Signed)
Patient would like an appointment. Patient has been experiencing sensation of needing to use the bathroom. Noticed blood on paper when wiping. Has noticed some black stool. Mild pain in rectum after using bathroom. No abdominal pain. Has had this issue for a week. Had this issue in the past but usually last less than 1 day. Has not taken medication for this complaint

## 2022-05-18 NOTE — Telephone Encounter (Signed)
If she is having epigastric pain and black stools and is not taking iron or pepto bismal which can cause black stools, she should go to urgent care.

## 2022-05-21 NOTE — Telephone Encounter (Signed)
Patient confirmed that she will go to urgent care

## 2022-06-08 ENCOUNTER — Ambulatory Visit (HOSPITAL_COMMUNITY)
Admission: EM | Admit: 2022-06-08 | Discharge: 2022-06-08 | Disposition: A | Discharge status: Home / Self Care | Payer: No Typology Code available for payment source | Attending: Family Medicine | Admitting: Family Medicine

## 2022-06-08 ENCOUNTER — Encounter (HOSPITAL_COMMUNITY): Payer: Self-pay

## 2022-06-08 DIAGNOSIS — K625 Hemorrhage of anus and rectum: Secondary | ICD-10-CM

## 2022-06-08 LAB — POC OCCULT BLOOD, ED: Fecal Occult Bld: NEGATIVE

## 2022-06-08 NOTE — ED Provider Notes (Signed)
[facility]    CSN: [id number] Arrival date & time: 06/08/22  1035      History   Chief Complaint Chief Complaint  Patient presents with   Blood In Stools    HPI Becky Holland is a 71 y.o. female.   Every time after a BM she notes blood on the toilet paper.   That just started about 1-2 months ago.  She did speak with her pcp about this, given stool cards and symptoms resolved.  Stool guiac was negative in July.  Symptoms restarted on Monday, but now noted blood in the underwear even between Bms.  It seems to be worsening.  No abdominal pain.   Stools is slightly loose at times, no diarrhea.  No blood that is mixed in with the stool.  She has never had a colonoscopy.  No dizziness or light headedness.        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   Gastroesophageal reflux disease without esophagitis 08/04/2020   Dental decay 08/04/2020   Rectal bleeding 01/21/2020   Postmenopausal 01/21/2020   Internal hemorrhoids 12/23/2019   Anal fissure 12/23/2019   Screening breast examination 08/04/2019   Breast pain, right 08/04/2019   Environmental and seasonal allergies 07/23/2019   Lipoma of torso 07/23/2019   Hyperkalemia 07/23/2019   Hypercholesteremia 07/23/2019   Rib pain on left side 07/23/2019   Essential hypertension 09/03/2017   Hypertension 2019    Past Surgical History:  Procedure Laterality Date   ABDOMINAL HYSTERECTOMY  1992   For fibroid tumor; also left Oophorectomy.     BREAST BIOPSY     Benign--about age 74   CERVICAL CONE BIOPSY     probably around age 37 or 44 for precancerous cells   EYE SURGERY Right 05/2018   [facility]   EYE SURGERY Left 07/2018   Dr  Lourena Simmonds are cataract  extractions    OB History     Gravida  3   Para      Term      Preterm      AB      Living         SAB      IAB      Ectopic      Multiple      Live Births  3            Home Medications    Prior to Admission medications   Medication Sig Start Date End Date Taking? Authorizing Provider  lisinopril (ZESTRIL) 10 MG tablet Take 1 tablet every day by oral route. 06/21/16  Yes [provider]  loratadine (CLARITIN) 10 MG tablet Take 1 tablet every day by oral route in the morning. 06/21/16  Yes [provider]  moxifloxacin (VIGAMOX) 0.5 % ophthalmic solution Apply to eye. 05/09/18  Yes [provider]  prednisoLONE acetate (PRED FORTE) 1 % ophthalmic suspension Apply to eye. 05/08/18  Yes [provider]  acetaminophen (TYLENOL) 500 MG tablet 2 pastillas dos por la boca dos veces al dia. Patient not taking: Reported on 03/05/2022 10/20/21   Mack Hook, MD  cetirizine (ZYRTEC) 10 MG tablet Take 1 tablet (10 mg total) by mouth daily. 01/21/20   Mack Hook, MD  famotidine (PEPCID) 20 MG tablet Take 1 tablet (20 mg total) by mouth 2 (two) times daily. Patient not taking: Reported on 03/05/2022 02/25/22   Nyoka Lint, PA-C  fluorometholone (FML) 0.1 % ophthalmic suspension Place 1 drop into both eyes 2 times daily. Patient not taking: Reported on 05/08/2022    [provider]  lisinopril-hydrochlorothiazide (ZESTORETIC) 10-12.5 MG tablet Take 1 tablet by mouth daily. 12/18/21   Mack Hook, MD  mometasone (NASONEX) 50 MCG/ACT nasal spray 2 sprays each nostril daily. Patient not taking: Reported on 05/08/2022 07/23/19   Mack Hook, MD  Multiple Vitamin (MULTI-VITAMIN) tablet Take by mouth daily.     [provider]  ondansetron (ZOFRAN) 4 MG tablet Take 1 tablet (4 mg total) by mouth every 8 (eight) hours as needed for nausea or vomiting. Patient not taking: Reported on 03/05/2022 02/25/22   Nyoka Lint, PA-C  Polyethyl Glycol-Propyl Glycol (SYSTANE) 0.4-0.3 % SOLN Apply to eye. Uses as needed. Patient not taking: Reported on 10/20/2021    [provider]  Probiotic Product (PROBIOTIC-10 PO) Take by mouth daily. 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  Rare now.     Drug use: Never     Allergies   Naproxen   Review of Systems Review of Systems  Constitutional: Negative.   HENT: Negative.    Respiratory: Negative.    Cardiovascular: Negative.   Gastrointestinal:  Positive for blood in stool. Negative for abdominal pain.  Musculoskeletal: Negative.   Skin: Negative.   Psychiatric/Behavioral: Negative.       Physical Exam Triage Vital Signs ED Triage Vitals [06/08/22 1154]  Enc Vitals Group     BP (!) 142/67     Pulse Rate 70     Resp 12     Temp 98.2 F (36.8 C)     Temp Source Oral     SpO2 99 %     Weight      Height      Head Circumference      Peak Flow      Pain Score 0     Pain Loc      Pain Edu?      Excl. in [place]?    No data found.  Updated Vital Signs BP (!) 142/67 (BP Location: Left Arm)   Pulse 70   Temp 98.2 F (36.8 C) (Oral)   Resp 12   SpO2 99%   Visual Acuity Right Eye Distance:   Left Eye Distance:   Bilateral Distance:    Right Eye Near:   Left Eye Near:    Bilateral Near:     Physical Exam Constitutional:      Appearance: Normal appearance.  Cardiovascular:      Rate and  Rhythm: Normal rate and regular rhythm.  Pulmonary:     Effort: Pulmonary effort is normal.     Breath sounds: Normal breath sounds.  Abdominal:     General: Bowel sounds are normal.     Palpations: Abdomen is soft.     Tenderness: There is no abdominal tenderness. There is no guarding or rebound.  Genitourinary:    Rectum: Normal. Guaiac result negative.  Musculoskeletal:        General: Normal range of motion.  Skin:    General: Skin is warm.  Neurological:     General: No focal deficit present.     Mental Status: She is alert.  Psychiatric:        Mood and Affect: Mood normal.      UC Treatments / Results  Labs (all labs ordered are listed, but only abnormal results are displayed) Labs Reviewed  POC OCCULT BLOOD, ED    EKG   Radiology No results found.  Procedures Procedures (including critical care time)  Medications Ordered in UC Medications - No data to display  Initial Impression / Assessment and Plan / UC Course  I have reviewed the triage vital signs and the nursing notes.  Pertinent labs & imaging results that were available during my care of the patient were reviewed by me and considered in my medical decision making (see chart for details).    Final Clinical Impressions(s) / UC Diagnoses   Final diagnoses:  Rectal bleeding     Discharge Instructions       Le atendieron hoy por sangrado rectal. Tu examen de hoy fue normal. Sin embargo, dado su historial de sangrado recurrente, debe realizar un seguimiento con gastroentrologa y probablemente necesite una colonoscopia. Llame a su proveedor de atencin primaria para programar una cita para esto. Si presenta un sangrado que empeora, mareos o dolor abdominal, vaya a la sala de emergencias para una evaluacin.  You were seen today for rectal bleeding.  Your exam today was normal.  However, given your history of recurrent bleeding you should follow up with gastroentrology and likely need a  colonoscopy.  Please call your primary care provider to be scheduled for this.  If you develop worsening bleeding, dizziness or abdominal pain then please go to the ER for evaluation.     ED Prescriptions   None    PDMP not reviewed this encounter.   Rondel Oh, MD 06/08/22 1239

## 2022-06-08 NOTE — ED Triage Notes (Signed)
Pt states after she has a bowel movement she noticed when she wipes and sometimes she see blood even when she do not have a bowel movement she see blood  4 days. Pt denies constipation

## 2022-06-08 NOTE — Discharge Instructions (Signed)
  Le atendieron hoy por sangrado rectal. Tu examen de hoy fue normal. Sin embargo, dado su historial de sangrado recurrente, debe realizar un seguimiento con gastroentrologa y probablemente necesite una colonoscopia. Llame a su proveedor de atencin primaria para programar una cita para esto. Si presenta un sangrado que empeora, mareos o dolor abdominal, vaya a la sala de emergencias para una evaluacin.  You were seen today for rectal bleeding.  Your exam today was normal.  However, given your history of recurrent bleeding you should follow up with gastroentrology and likely need a colonoscopy.  Please call your primary care provider to be scheduled for this.  If you develop worsening bleeding, dizziness or abdominal pain then please go to the ER for evaluation.

## 2022-06-11 ENCOUNTER — Encounter: Payer: Self-pay | Admitting: Internal Medicine

## 2022-06-11 ENCOUNTER — Ambulatory Visit: Payer: Self-pay | Admitting: Internal Medicine

## 2022-06-11 VITALS — BP 132/78 | HR 80 | Resp 16 | Ht <= 58 in | Wt 129.0 lb

## 2022-06-11 DIAGNOSIS — K625 Hemorrhage of anus and rectum: Secondary | ICD-10-CM

## 2022-06-11 DIAGNOSIS — M47817 Spondylosis without myelopathy or radiculopathy, lumbosacral region: Secondary | ICD-10-CM | POA: Insufficient documentation

## 2022-06-11 DIAGNOSIS — Z23 Encounter for immunization: Secondary | ICD-10-CM

## 2022-06-11 DIAGNOSIS — E7800 Pure hypercholesterolemia, unspecified: Secondary | ICD-10-CM

## 2022-06-11 DIAGNOSIS — I8393 Asymptomatic varicose veins of bilateral lower extremities: Secondary | ICD-10-CM | POA: Insufficient documentation

## 2022-06-11 MED ORDER — HYDROCORTISONE ACETATE 25 MG RE SUPP: RECTAL | 2 refills | Status: DC

## 2022-06-11 MED ORDER — METAMUCIL SMOOTH TEXTURE 58.6 % PO POWD: ORAL | 12 refills | Status: DC

## 2022-06-11 NOTE — Progress Notes (Unsigned)
    Subjective:    Patient ID: Becky Holland, female   DOB: [birth year], 71 y.o.   MRN: [id number]   HPI   Rectal bleeding:  went to urgent care on 06/08/2022 for recurrence.  States was noting BRBPR on tissue each time she had BM for about 1 week prior to urgent care visit.  Was also using a sanitary pad and noted minimal amount on the pad in between BMs the day she went to urgent care.   Denies straining with hard or loose stools when bleeding starts.  Notes the blood to be mixed with mucous.  Does not note any blood mixed or outside of stool.  Only sees blood on tissue or the one instance on a pad.  No pain associated with passing a stool After cleaning, area around anus burns and feels "tight"  no itching.    No abdominal pain. Good appetite. Test for blood in stool in urgent care also negative.    FIT testing following CPE in July normal and CBC was normal as well.  Was having some blood on tissue then as well.   Has chronic recurrent history of BRBPR with findings of anal fissuring and hemorrhoids in past.  Not able to get in for diagnostic colonoscopy through [facility] previously.  2.  Hypercholesterolemia:  total and LDL a bit lower.  HDL remains high at 67.  Current Meds  Medication Sig   cetirizine (ZYRTEC) 10 MG tablet Take 1 tablet (10 mg total) by mouth daily.   lisinopril (ZESTRIL) 10 MG tablet Take 1 tablet every day by oral route.   lisinopril-hydrochlorothiazide (ZESTORETIC) 10-12.5 MG tablet Take 1 tablet by mouth daily.   loratadine (CLARITIN) 10 MG tablet Take 1 tablet every day by oral route in the morning.   moxifloxacin (VIGAMOX) 0.5 % ophthalmic solution Apply to eye.   Multiple Vitamin (MULTI-VITAMIN) tablet Take by mouth daily.    prednisoLONE acetate (PRED FORTE) 1 % ophthalmic suspension Apply to eye.   Allergies  Allergen Reactions   Naproxen Swelling and Other (See Comments)    Eyes swelled with Naproxen  Other reaction(s): eye  swelling Does well with Ibuprofen and Motrin, per patinet      Review of Systems    Objective:   BP 132/78 (BP Location: Left Arm, Patient Position: Sitting, Cuff Size: Normal)   Pulse 80   Resp 16   Ht 4' 8.5" (1.435 m)   Wt 129 lb (58.5 kg)   BMI 28.41 kg/m   Physical Exam NAD Abd:  S, NT, No HSM or mass.   Anoscopy:  lying on left:  beefy appearing internal hemorrhoid at about what would b 4 or 5 O'clock if recumbent.  No active bleeding.  Anal area irritated as well.     Assessment & Plan    Rectal bleeding with anal irritation and internal hemorrhoid:  Anusol HC supp twice daily for 3 days and then twice daily as needed.  CBC was normal 2 months ago.  Will recheck CBC today. Clean with Tucks.  Pat dry. Referral for diagnostic colonoscopy.  Will need to apply for financial assistance from [facility].  2.  Hypercholesterolemia:  LDL and total improved    3.  HM:  Fluad influenza vaccine and Spikevax COVID.

## 2022-06-11 NOTE — Telephone Encounter (Signed)
Appears to be an erroneous encounter entry.

## 2022-06-11 NOTE — Patient Instructions (Signed)
Tucks to clean after BM--past area clean--do not wipe and do not clean aggressively.

## 2022-06-12 LAB — CBC WITH DIFFERENTIAL/PLATELET
Basophils Absolute: 0.1 10*3/uL (ref 0.0–0.2)
Basos: 1 %
EOS (ABSOLUTE): 0.1 10*3/uL (ref 0.0–0.4)
Eos: 2 %
Hematocrit: 37.8 % (ref 34.0–46.6)
Hemoglobin: 13.3 g/dL (ref 11.1–15.9)
Immature Grans (Abs): 0 10*3/uL (ref 0.0–0.1)
Immature Granulocytes: 0 %
Lymphocytes Absolute: 2.4 10*3/uL (ref 0.7–3.1)
Lymphs: 34 %
MCH: 33.5 pg — ABNORMAL HIGH (ref 26.6–33.0)
MCHC: 35.2 g/dL (ref 31.5–35.7)
MCV: 95 fL (ref 79–97)
Monocytes Absolute: 0.6 10*3/uL (ref 0.1–0.9)
Monocytes: 9 %
Neutrophils Absolute: 3.8 10*3/uL (ref 1.4–7.0)
Neutrophils: 54 %
Platelets: 482 10*3/uL — ABNORMAL HIGH (ref 150–450)
RBC: 3.97 x10E6/uL (ref 3.77–5.28)
RDW: 12.1 % (ref 11.7–15.4)
WBC: 7 10*3/uL (ref 3.4–10.8)

## 2022-08-26 ENCOUNTER — Ambulatory Visit (HOSPITAL_COMMUNITY)
Admission: EM | Admit: 2022-08-26 | Discharge: 2022-08-26 | Disposition: A | Discharge status: Home / Self Care | Payer: No Typology Code available for payment source | Attending: Emergency Medicine | Admitting: Emergency Medicine

## 2022-08-26 ENCOUNTER — Encounter (HOSPITAL_COMMUNITY): Payer: Self-pay | Admitting: *Deleted

## 2022-08-26 DIAGNOSIS — N3001 Acute cystitis with hematuria: Secondary | ICD-10-CM | POA: Insufficient documentation

## 2022-08-26 LAB — POCT URINALYSIS DIPSTICK, ED / UC
Bilirubin Urine: NEGATIVE
Glucose, UA: NEGATIVE mg/dL
Ketones, ur: NEGATIVE mg/dL
Nitrite: POSITIVE — AB
Protein, ur: NEGATIVE mg/dL
Specific Gravity, Urine: <=1.005 (ref 1.005–1.030)
Urobilinogen, UA: 0.2 mg/dL (ref 0.0–1.0)
pH: 6 (ref 5.0–8.0)

## 2022-08-26 MED ORDER — CEPHALEXIN 500 MG PO CAPS: 500.0000 mg | ORAL_CAPSULE | Freq: Two times a day (BID) | ORAL | 0 refills | Status: DC

## 2022-08-26 MED ORDER — PHENAZOPYRIDINE HCL 200 MG PO TABS: 200.0000 mg | ORAL_TABLET | Freq: Three times a day (TID) | ORAL | 0 refills | Status: DC

## 2022-08-26 NOTE — ED Provider Notes (Signed)
[employer]    CSN: [id number] Arrival date & time: 08/26/22  1007      History   Chief Complaint Chief Complaint  Patient presents with   Hematuria    HPI Becky Holland is a 71 y.o. female.   Patient presents for evaluation of hematuria, dysuria and lower abdominal pressure beginning 1 day ago.  Has attempted use of ibuprofen which has been minimally effective.  Denies urinary frequency, urgency, flank pain, fever, chills or vaginal symptoms.    Past Medical History:  Diagnosis Date   Hypertension 2019    Patient Active Problem List   Diagnosis Date Noted   DJD (degenerative joint disease), lumbosacral 06/11/2022   Varicose veins of both lower extremities 06/11/2022   Acute gastritis 03/05/2022   Pain, dental 10/20/2021   Decreased visual acuity 10/20/2021   Chronic bilateral low back pain with bilateral sciatica 10/20/2021   Gastroesophageal reflux disease without esophagitis 08/04/2020   Dental decay 08/04/2020   Rectal bleeding 01/21/2020   Postmenopausal 01/21/2020   Internal hemorrhoids 12/23/2019   Anal fissure 12/23/2019   Screening breast examination 08/04/2019   Breast pain, right 08/04/2019   Environmental and seasonal allergies 07/23/2019   Lipoma of torso 07/23/2019   Hyperkalemia 07/23/2019   Hypercholesteremia 07/23/2019   Rib pain on left side 07/23/2019   Essential hypertension 09/03/2017   Hypertension 2019    Past Surgical History:  Procedure Laterality Date   ABDOMINAL HYSTERECTOMY  1992   For fibroid tumor; also left Oophorectomy.     BREAST BIOPSY     Benign--about age 68   CERVICAL CONE BIOPSY     probably around age 31 or 27 for precancerous cells   EYE SURGERY Right 05/2018   [place]   EYE SURGERY Left 07/2018   Dr  Lourena Simmonds are cataract extractions    OB History     Gravida  3   Para      Term      Preterm      AB      Living         SAB      IAB      Ectopic       Multiple      Live Births  3            Home Medications    Prior to Admission medications   Medication Sig Start Date End Date Taking? Authorizing Provider  lisinopril-hydrochlorothiazide (ZESTORETIC) 10-12.5 MG tablet Take 1 tablet by mouth daily. 12/18/21  Yes Mack Hook, MD  cetirizine (ZYRTEC) 10 MG tablet Take 1 tablet (10 mg total) by mouth daily. 01/21/20   Mack Hook, MD  hydrocortisone (ANUSOL-HC) 25 MG suppository Place 1 suppository twice daily after BM for 3 days then use twice daily as needed. 06/11/22   Mack Hook, MD  lisinopril (ZESTRIL) 10 MG tablet Take 1 tablet every day by oral route. 06/21/16   [provider]  loratadine (CLARITIN) 10 MG tablet Take 1 tablet every day by oral route in the morning. 06/21/16   [provider]  moxifloxacin (VIGAMOX) 0.5 % ophthalmic solution Apply to eye. 05/09/18   [provider]  Multiple Vitamin (MULTI-VITAMIN) tablet Take by mouth daily.     [provider]  prednisoLONE acetate (PRED FORTE) 1 % ophthalmic suspension Apply to eye. 05/08/18   [provider]  Probiotic Product (PROBIOTIC-10 PO) Take by mouth daily. Unsure of doseage Patient not taking: Reported on 06/11/2022  [provider]  psyllium (METAMUCIL SMOOTH TEXTURE) 58.6 % powder 1 packet in 8 oz water and drink daily. 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  Rare now.     Drug use: Never     Allergies   Naproxen   Review of Systems Review of Systems  Constitutional: Negative.   HENT: Negative.    Respiratory: Negative.    Cardiovascular: Negative.   Genitourinary:  Positive for dysuria, hematuria and pelvic pain. Negative for decreased urine volume, difficulty urinating, dyspareunia, enuresis, flank pain, frequency, genital sores, menstrual problem, urgency, vaginal bleeding, vaginal discharge and vaginal pain.  Skin: Negative.      Physical Exam Triage Vital Signs ED Triage Vitals  Enc Vitals Group     BP 08/26/22 1032 125/80     Pulse Rate 08/26/22 1032 84     Resp 08/26/22 1032 18     Temp 08/26/22 1032 98 F (36.7 C)     Temp Source 08/26/22 1032 Oral     SpO2 08/26/22 1032 98 %     Weight --      Height --      Head Circumference --      Peak Flow --      Pain Score 08/26/22 1034 0     Pain Loc --      Pain Edu? --      Excl. in [place]? --    No data found.  Updated Vital Signs BP 125/80   Pulse 84   Temp 98 F (36.7 C) (Oral)   Resp 18   SpO2 98%   Visual Acuity Right Eye Distance:   Left Eye Distance:   Bilateral Distance:    Right Eye Near:   Left Eye Near:    Bilateral Near:     Physical Exam Constitutional:      Appearance: Normal appearance.  Eyes:     Extraocular Movements: Extraocular movements intact.  Pulmonary:     Effort: Pulmonary effort is normal.  Abdominal:     General: There is no distension.     Tenderness: There is no abdominal tenderness. There is right CVA tenderness and left CVA tenderness. There is no guarding.  Neurological:     Mental Status: She is alert and oriented to person, place, and time. Mental status is at baseline.  Psychiatric:        Mood and Affect: Mood normal.        Behavior: Behavior normal.      UC Treatments / Results  Labs (all labs ordered are listed, but only abnormal results are displayed) Labs Reviewed  POCT URINALYSIS DIPSTICK,  ED / UC - Abnormal; Notable for the following components:      Result Value   Hgb  urine dipstick MODERATE (*)    Nitrite POSITIVE (*)    Leukocytes,Ua TRACE (*)    All other components within normal limits    EKG   Radiology No results found.  Procedures Procedures (including critical care time)  Medications Ordered in UC Medications - No data to display  Initial Impression / Assessment and Plan / UC Course  I have reviewed the triage vital signs and the nursing notes.  Pertinent labs & imaging results that were available during my care of the patient were reviewed by me and considered in my medical decision making (see chart for details).  Acute cystitis with hematuria  Urinalysis showing leukocytes and nitrates, sent for culture, discussed findings with patient, Keflex prescribed as well as Pyridium for comfort, recommended increase fluid intake and good hygiene measures for additional support, given that if symptoms persist or worsen to follow-up for reevaluation Final Clinical Impressions(s) / UC Diagnoses   Final diagnoses:  None   Discharge Instructions   None    ED Prescriptions   None    PDMP not reviewed this encounter.   Hans Eden, NP 08/26/22 1115

## 2022-08-26 NOTE — Discharge Instructions (Addendum)
Su anlisis de [place] glbulos blancos y nitratos que son indicativos de infeccin, su orina se enviar al laboratorio para Futures trader qu bacterias estn presentes, si es necesario realizar algn cambio en sus medicamentos, se le notificar  Comience a usar keflex todas las maanas y noches durante 5 das.  Puede usar piridio cada 8 horas segn sea necesario para ayudar a Event organiser. Tenga en cuenta que este medicamento har que su orina adquiera un color naranja nen brillante; esto es normal.  Aumente su ingesta de lquidos mediante el uso de Sunshine.  Como siempre, practique una buena higiene, limpindose de adelante Cook atrs y evitando productos vaginales perfumados para evitar una mayor irritacin.  Si los sntomas continan persistiendo despus del uso del medicamento o recurren, haga un seguimiento con atencin de [place] o con su mdico de atencin primaria segn sea necesario   Your urinalysis shows [place] blood cells and nitrates which are indicative of infection, your urine will be sent to the lab to determine exactly which bacteria is present, if any changes need to be made to your medications you will be notified  Begin use of keflex every morning and evening for 5 days   You may use pyridium every 8 hours as needed to help minimize discomfort, be mindful this medication will turn your urine a bright neon orange, this is normal  Increase your fluid intake through use of water  As always practice good hygiene, wiping front to back and avoidance of scented vaginal products to prevent further irritation  If symptoms continue to persist after use of medication or recur please follow-up with urgent care or your primary doctor as needed

## 2022-08-26 NOTE — ED Notes (Signed)
Pt states unable to provide urine sample at this time, as she had just voided prior to being brought back to exam room. PO fluids provided.

## 2022-08-26 NOTE — ED Triage Notes (Signed)
Via [employer] video interpreter [phone]: C/O hematuria onset yesterday; also c/o some dysuria (has taken IBU). Denies fevers.

## 2022-08-28 LAB — URINE CULTURE: Culture: >=100000 — AB

## 2022-09-14 ENCOUNTER — Telehealth: Payer: Self-pay | Admitting: Psychology

## 2022-09-19 MED ORDER — LISINOPRIL 10 MG PO TABS: ORAL_TABLET | ORAL | 9 refills | Status: DC

## 2022-09-19 NOTE — Telephone Encounter (Signed)
filled

## 2022-09-26 ENCOUNTER — Encounter: Payer: Self-pay | Admitting: Internal Medicine

## 2022-10-16 ENCOUNTER — Other Ambulatory Visit: Payer: Self-pay | Admitting: Internal Medicine

## 2023-06-17 ENCOUNTER — Telehealth: Payer: Self-pay | Admitting: Internal Medicine

## 2023-06-17 DIAGNOSIS — Z1231 Encounter for screening mammogram for malignant neoplasm of breast: Secondary | ICD-10-CM

## 2023-06-26 ENCOUNTER — Other Ambulatory Visit: Payer: Self-pay | Admitting: Internal Medicine

## 2023-06-26 ENCOUNTER — Ambulatory Visit: Payer: Self-pay

## 2023-06-26 DIAGNOSIS — Z0000 Encounter for general adult medical examination without abnormal findings: Secondary | ICD-10-CM

## 2023-06-28 ENCOUNTER — Encounter: Payer: Self-pay | Admitting: Gastroenterology

## 2023-06-28 NOTE — Telephone Encounter (Signed)
Patient has been scheduled with GI

## 2023-06-28 NOTE — Telephone Encounter (Signed)
Patient will attempt make herself an appointment. Patient will call if she has any questions or concerns.

## 2023-07-04 NOTE — Telephone Encounter (Signed)
Lvm asking to return call

## 2023-07-08 ENCOUNTER — Telehealth: Payer: Self-pay

## 2023-07-08 NOTE — Telephone Encounter (Signed)
Mailed patient mammogram scholarship application. [facility]

## 2023-07-26 NOTE — Telephone Encounter (Signed)
Patient has been schedule.

## 2023-09-05 ENCOUNTER — Ambulatory Visit: Payer: Self-pay | Admitting: Gastroenterology

## 2023-09-06 ENCOUNTER — Other Ambulatory Visit (INDEPENDENT_AMBULATORY_CARE_PROVIDER_SITE_OTHER): Payer: Self-pay

## 2023-09-06 DIAGNOSIS — R3915 Urgency of urination: Secondary | ICD-10-CM

## 2023-09-06 LAB — POCT URINALYSIS DIPSTICK
Bilirubin, UA: NEGATIVE
Glucose, UA: NEGATIVE
Ketones, UA: NEGATIVE
Nitrite, UA: NEGATIVE
Protein, UA: NEGATIVE
Spec Grav, UA: 1.01 (ref 1.010–1.025)
Urobilinogen, UA: 0.2 U/dL
pH, UA: 6 (ref 5.0–8.0)

## 2023-09-06 MED ORDER — CEPHALEXIN 500 MG PO CAPS: 500.0000 mg | ORAL_CAPSULE | Freq: Two times a day (BID) | ORAL | 0 refills | Status: DC

## 2023-09-06 NOTE — Progress Notes (Signed)
 Patient reports hematuria and urinary urgency which started yesterday 09/05/2023. Patient has been taking AZO which helped which urinary frequency.  Notice that patient has been seen for UTI in ED on 08/27/2023. She was prescribed Keflex , but after checking in with patient I found that patient never picked up medication. We resent medication to the pharmacy and notified patient. Patient instructed to call us  after the weekend with a progress report.

## 2023-10-10 ENCOUNTER — Other Ambulatory Visit: Payer: Self-pay | Admitting: Internal Medicine

## 2023-10-14 ENCOUNTER — Telehealth: Payer: Self-pay

## 2023-10-14 NOTE — Telephone Encounter (Signed)
 Called patient to inform her that she missed her GI appointment. Told her to call [phone] to rescheduled. Patient initially reported that she missed the appointment because no one ever called her to make an appointment, but I reminded her that I helped her set up appointment. She then stated that she assumed it was going to be expensive, I reminded patient that we had a conversation about applying for financial assistance and she previously understood instructions for that process.  Patient also instructed to come by the clinic to fill out [facility] scholarship application. [facility] mailed her an application, but patient reports that she never received it.

## 2023-10-16 NOTE — Telephone Encounter (Signed)
So what does she want to do?   Recommend she get evaluated and set up financial assistance, but cannot force her to do so.

## 2023-10-16 NOTE — Telephone Encounter (Signed)
Patient reported that she will call to reschedule her GI appointment and plans to come by to get [facility] application.

## 2023-12-12 ENCOUNTER — Encounter: Payer: Self-pay | Admitting: Internal Medicine

## 2023-12-12 ENCOUNTER — Telehealth: Payer: Self-pay

## 2023-12-12 NOTE — Telephone Encounter (Signed)
 Called patient around 8:31am to ask about her appointment. Appointment was at 8:30am , patient answer and said she knew her appointment was at 8:30am I mentioned to her it was 8:30 and she stated she was about to be on her way. I did mention to her our grace period and asked if she would be able to get here before 8:40am . Patient stayed quiet and I said hello?, patient just said " whatever forget about it" and hung up.

## 2024-03-04 ENCOUNTER — Other Ambulatory Visit: Payer: Self-pay | Admitting: Internal Medicine

## 2024-03-05 ENCOUNTER — Telehealth: Payer: Self-pay | Admitting: Internal Medicine

## 2024-03-05 NOTE — Telephone Encounter (Signed)
 Patient needs a refill of medication lisinopril  patient states she was told by the pharmacy that she does not have more refills. Patient has not been seen by Doctor Adella since 06/11/2022.  Notified patient she needs to be seen by the doctor to be able to continue getting refills.  Patient has been scheduled for 05/27/24.

## 2024-03-10 MED ORDER — LISINOPRIL-HYDROCHLOROTHIAZIDE 10-12.5 MG PO TABS: 1.0000 | ORAL_TABLET | Freq: Every day | ORAL | 3 refills | Status: DC

## 2024-03-10 MED ORDER — LISINOPRIL 10 MG PO TABS: ORAL_TABLET | ORAL | 3 refills | Status: DC

## 2024-05-27 ENCOUNTER — Ambulatory Visit: Payer: Self-pay | Admitting: Internal Medicine

## 2024-06-05 ENCOUNTER — Encounter: Payer: Self-pay | Admitting: Internal Medicine

## 2024-06-29 ENCOUNTER — Other Ambulatory Visit: Payer: Self-pay

## 2024-07-02 ENCOUNTER — Ambulatory Visit: Payer: Self-pay | Admitting: Internal Medicine

## 2024-07-02 VITALS — BP 150/105 | HR 99 | Resp 15 | Ht <= 58 in | Wt 132.0 lb

## 2024-07-02 DIAGNOSIS — R413 Other amnesia: Secondary | ICD-10-CM

## 2024-07-02 DIAGNOSIS — R1031 Right lower quadrant pain: Secondary | ICD-10-CM

## 2024-07-02 DIAGNOSIS — H11003 Unspecified pterygium of eye, bilateral: Secondary | ICD-10-CM

## 2024-07-02 DIAGNOSIS — K029 Dental caries, unspecified: Secondary | ICD-10-CM

## 2024-07-02 DIAGNOSIS — Z23 Encounter for immunization: Secondary | ICD-10-CM

## 2024-07-02 DIAGNOSIS — H547 Unspecified visual loss: Secondary | ICD-10-CM

## 2024-07-02 MED ORDER — LISINOPRIL 10 MG PO TABS: ORAL_TABLET | ORAL | 3 refills | Status: AC

## 2024-07-02 MED ORDER — LISINOPRIL-HYDROCHLOROTHIAZIDE 10-12.5 MG PO TABS: 1.0000 | ORAL_TABLET | Freq: Every day | ORAL | 3 refills | Status: DC
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
## 2024-07-02 NOTE — Progress Notes (Unsigned)
 Patient was seen by Charmaine and Molly. Screened for depression, anxiety, and SDOH. No concerns at this time. Informed about weekly and monthly food pantry.     07/02/2024    9:51 AM  Depression screen PHQ 2/9  Decreased Interest 0  Down, Depressed, Hopeless 0  PHQ - 2 Score 0  Altered sleeping 0  Tired, decreased energy 0  Change in appetite 0  Feeling bad or failure about yourself  0  Trouble concentrating 0  Moving slowly or fidgety/restless 0  Suicidal thoughts 0  PHQ-9 Score 0       07/02/2024    9:51 AM  GAD 7 : Generalized Anxiety Score  Nervous, Anxious, on Edge 3  Control/stop worrying 0  Worry too much - different things 0  Trouble relaxing 0  Restless 0  Easily annoyed or irritable 0  Afraid - awful might happen 0  Total GAD 7 Score 3     SDOH Screenings   Food Insecurity: No Food Insecurity (07/02/2024)  Housing: Low Risk  (07/02/2024)  Transportation Needs: No Transportation Needs (07/02/2024)  Utilities: Not At Risk (07/02/2024)  Depression (PHQ2-9): Low Risk  (07/02/2024)  Financial Resource Strain: Low Risk  (03/05/2022)  Social Connections: Unknown (02/14/2022)   Received from [facility]  Tobacco Use: Medium Risk (08/26/2022)

## 2024-07-03 ENCOUNTER — Encounter: Payer: Self-pay | Admitting: Internal Medicine

## 2024-07-03 MED ORDER — DONEPEZIL HCL 5 MG PO TABS: 5.0000 mg | ORAL_TABLET | Freq: Every day | ORAL | 11 refills | Status: AC

## 2024-07-17 ENCOUNTER — Other Ambulatory Visit: Payer: Self-pay

## 2024-07-17 VITALS — BP 140/80 | HR 95

## 2024-07-17 DIAGNOSIS — E7800 Pure hypercholesterolemia, unspecified: Secondary | ICD-10-CM

## 2024-07-17 DIAGNOSIS — Z0130 Encounter for examination of blood pressure without abnormal findings: Secondary | ICD-10-CM

## 2024-07-17 DIAGNOSIS — Z79899 Other long term (current) drug therapy: Secondary | ICD-10-CM

## 2024-07-17 NOTE — Progress Notes (Signed)
 Patient reported that she is taking bp medication consistently. Patient did not take bp medication this morning.   Patient instructed to take 2 tabs of lisinopril  10mg  daily. Patient has been scheduled to return in 3 weeks for bp check.

## 2024-07-18 LAB — COMPREHENSIVE METABOLIC PANEL WITH GFR
ALT: 19 IU/L (ref 0–32)
AST: 23 IU/L (ref 0–40)
Albumin: 4.4 g/dL (ref 3.8–4.8)
Alkaline Phosphatase: 87 IU/L (ref 49–135)
BUN/Creatinine Ratio: 22 (ref 12–28)
BUN: 20 mg/dL (ref 8–27)
Bilirubin Total: 0.5 mg/dL (ref 0.0–1.2)
CO2: 25 mmol/L (ref 20–29)
Calcium: 9.9 mg/dL (ref 8.7–10.3)
Chloride: 102 mmol/L (ref 96–106)
Creatinine, Ser: 0.93 mg/dL (ref 0.57–1.00)
Globulin, Total: 2.4 g/dL (ref 1.5–4.5)
Glucose: 107 mg/dL — ABNORMAL HIGH (ref 70–99)
Potassium: 4.6 mmol/L (ref 3.5–5.2)
Sodium: 139 mmol/L (ref 134–144)
Total Protein: 6.8 g/dL (ref 6.0–8.5)
eGFR: 65 mL/min/1.73 (ref >59)

## 2024-07-18 LAB — CBC WITH DIFFERENTIAL/PLATELET
Basophils Absolute: 0.1 x10E3/uL (ref 0.0–0.2)
Basos: 1 %
EOS (ABSOLUTE): 0.1 x10E3/uL (ref 0.0–0.4)
Eos: 2 %
Hematocrit: 36.4 % (ref 34.0–46.6)
Hemoglobin: 12.6 g/dL (ref 11.1–15.9)
Immature Grans (Abs): 0 x10E3/uL (ref 0.0–0.1)
Immature Granulocytes: 0 %
Lymphocytes Absolute: 2.1 x10E3/uL (ref 0.7–3.1)
Lymphs: 34 %
MCH: 33.7 pg — ABNORMAL HIGH (ref 26.6–33.0)
MCHC: 34.6 g/dL (ref 31.5–35.7)
MCV: 97 fL (ref 79–97)
Monocytes Absolute: 0.6 x10E3/uL (ref 0.1–0.9)
Monocytes: 9 %
Neutrophils Absolute: 3.4 x10E3/uL (ref 1.4–7.0)
Neutrophils: 53 %
Platelets: 483 x10E3/uL — ABNORMAL HIGH (ref 150–450)
RBC: 3.74 x10E6/uL — ABNORMAL LOW (ref 3.77–5.28)
RDW: 13.1 % (ref 11.7–15.4)
WBC: 6.3 x10E3/uL (ref 3.4–10.8)

## 2024-07-18 LAB — LIPID PANEL
Chol/HDL Ratio: 3.5 ratio (ref 0.0–4.4)
Cholesterol, Total: 250 mg/dL — ABNORMAL HIGH (ref 100–199)
HDL: 72 mg/dL (ref >39)
LDL Chol Calc (NIH): 164 mg/dL — ABNORMAL HIGH (ref 0–99)
Triglycerides: 85 mg/dL (ref 0–149)
VLDL Cholesterol Cal: 14 mg/dL (ref 5–40)

## 2024-08-07 ENCOUNTER — Ambulatory Visit: Payer: Self-pay | Admitting: Internal Medicine

## 2024-08-07 VITALS — BP 140/80 | HR 78

## 2024-08-07 DIAGNOSIS — Z0130 Encounter for examination of blood pressure without abnormal findings: Secondary | ICD-10-CM

## 2024-08-07 MED ORDER — LISINOPRIL-HYDROCHLOROTHIAZIDE 20-12.5 MG PO TABS: 1.0000 | ORAL_TABLET | Freq: Every day | ORAL | 3 refills | Status: AC

## 2024-08-07 NOTE — Progress Notes (Signed)
 Talk to daughter, Leita, and see if pillbox set up and she is following behind her mother to make sure she is taking.   If she is, increase Lisinopril  hydrochlorothiazide  to 20 mg/12.5 mg Repeat BP in 2 weeks.

## 2024-08-07 NOTE — Progress Notes (Signed)
 Patient reports that she is taking Bp medication consistently. Patient took medication this morning.   Patients daughter confirmed that patient has a pillbox and they have been following behind her to confirm that she is taking medication. Lisinopril -hydrochlorothiazide  20mg /12.5 mg has been sent to the pharmacy and patient has been scheduled for bp check in 2 weeks.

## 2024-08-20 ENCOUNTER — Ambulatory Visit: Payer: Self-pay

## 2024-08-20 VITALS — BP 130/80 | HR 87

## 2024-08-20 DIAGNOSIS — Z0130 Encounter for examination of blood pressure without abnormal findings: Secondary | ICD-10-CM

## 2024-08-20 NOTE — Progress Notes (Unsigned)
 The bp today = 130/80  The patient says the she is getting Lisnopril 10 and combination of Lisnopiril- Hydrochlorothiazide  20-12.5   The patient is asking for lisinopril  refill but I see that she still has refill. We need to call her and remind her.   Patients daughter was notified that patient has refills for bp medication, she just need to request it at the pharmacy.

## 2024-09-28 ENCOUNTER — Ambulatory Visit: Payer: Self-pay | Admitting: Internal Medicine

## 2024-09-29 ENCOUNTER — Ambulatory Visit: Payer: Self-pay | Admitting: Internal Medicine

## 2024-09-29 MED ORDER — ATORVASTATIN CALCIUM 20 MG PO TABS: 20.0000 mg | ORAL_TABLET | Freq: Every day | ORAL | 11 refills | Status: AC

## 2024-10-01 ENCOUNTER — Ambulatory Visit: Payer: Self-pay | Admitting: Internal Medicine

## 2024-11-02 ENCOUNTER — Ambulatory Visit: Payer: Self-pay | Admitting: Internal Medicine
